# Patient Record
Sex: Female | Born: 2000 | Race: White | Hispanic: No | Marital: Single | Smoking: Never smoker
Health system: Southern US, Community
[De-identification: ages and names within clinical notes are randomized; demographics above are authoritative.]

## PROBLEM LIST (undated history)

## (undated) DIAGNOSIS — G4700 Insomnia, unspecified: Secondary | ICD-10-CM

## (undated) DIAGNOSIS — F419 Anxiety disorder, unspecified: Secondary | ICD-10-CM

---
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
## 2021-03-09 ENCOUNTER — Encounter: Payer: Self-pay | Admitting: Psychology

## 2021-06-08 ENCOUNTER — Other Ambulatory Visit: Payer: Self-pay | Admitting: Student

## 2021-06-08 DIAGNOSIS — S83105D Unspecified dislocation of left knee, subsequent encounter: Secondary | ICD-10-CM

## 2021-06-08 DIAGNOSIS — M25362 Other instability, left knee: Secondary | ICD-10-CM

## 2021-06-08 DIAGNOSIS — M2410 Other articular cartilage disorders, unspecified site: Secondary | ICD-10-CM

## 2021-06-28 ENCOUNTER — Other Ambulatory Visit: Payer: BC Managed Care – PPO

## 2021-06-30 ENCOUNTER — Ambulatory Visit
Admission: RE | Admit: 2021-06-30 | Discharge: 2021-06-30 | Disposition: A | Discharge status: Home / Self Care | Payer: BC Managed Care – PPO | Source: Ambulatory Visit | Attending: Student | Admitting: Student

## 2021-06-30 DIAGNOSIS — M25361 Other instability, right knee: Secondary | ICD-10-CM

## 2021-06-30 DIAGNOSIS — M2410 Other articular cartilage disorders, unspecified site: Secondary | ICD-10-CM

## 2021-06-30 DIAGNOSIS — S83105D Unspecified dislocation of left knee, subsequent encounter: Secondary | ICD-10-CM

## 2021-07-18 ENCOUNTER — Ambulatory Visit
Admission: RE | Admit: 2021-07-18 | Discharge: 2021-07-18 | Disposition: A | Discharge status: Home / Self Care | Payer: BC Managed Care – PPO | Source: Ambulatory Visit | Attending: Orthopedic Surgery | Admitting: Orthopedic Surgery

## 2021-07-18 ENCOUNTER — Ambulatory Visit
Admission: RE | Admit: 2021-07-18 | Discharge: 2021-07-18 | Disposition: A | Discharge status: Home / Self Care | Payer: BC Managed Care – PPO | Attending: Orthopedic Surgery | Admitting: Orthopedic Surgery

## 2021-07-18 ENCOUNTER — Other Ambulatory Visit: Payer: Self-pay | Admitting: Orthopedic Surgery

## 2021-07-18 DIAGNOSIS — S83006A Unspecified dislocation of unspecified patella, initial encounter: Secondary | ICD-10-CM | POA: Diagnosis present

## 2021-07-20 ENCOUNTER — Encounter: Payer: Self-pay | Admitting: Psychology

## 2021-07-20 ENCOUNTER — Encounter: Payer: BC Managed Care – PPO | Attending: Psychology | Admitting: Psychology

## 2021-07-20 DIAGNOSIS — R41840 Attention and concentration deficit: Secondary | ICD-10-CM | POA: Diagnosis present

## 2021-07-20 DIAGNOSIS — F840 Autistic disorder: Secondary | ICD-10-CM | POA: Diagnosis present

## 2021-07-20 NOTE — Progress Notes (Signed)
Neuropsychological Consultation   Patient:   Jacqueline Burch   DOB:   08/05/2000  MR Number:  [id number]  Location:  [facility] PHYSICAL MEDICINE AND REHABILITATION [address] [place], [address] [id number] [facility] [place] [address] Dept: [phone]           Date of Service:   07/20/2021  Start Time:   3 PM End Time:   5 PM  Today's visit was an in person visit dose conducted in my outpatient clinic office.  The patient, her mother and myself were present for this visit.  1 hour and 15 minutes was spent in face-to-face clinical interview and the other 45 minutes was spent with records review, report writing and setting up testing protocols.  Provider/Observer:  Arley Phenix, Psy.D.       Clinical Neuropsychologist       Billing Code/Service: 96116/96121  Chief Complaint:    Jacqueline Burch is a 21 year old female referred by her treating neurologist Hemang Wallis Mart, MD for neuropsychological evaluation to facilitate with differential diagnostic considerations.  The patient has been diagnosed with primary insomnia with excessive daytime somnolence without signs or symptoms suggestive of cataplexy, hypnagogic or hypnopompic hallucinations.  There are also concerns about the possibility of an underlying attention deficit disorder and autistic spectrum disorder types of conditions.  The patient has had previous sleep studies conducted in 2020 through [facility] with Dr. Vickey Huger due to limited sleep intervals, being fatigued and exhausted during the day but unable to sleep, anxiety type symptoms.  Racing thoughts, worry and test anxiety are issues described are attributed in the past.  During this evaluation there were no indications of organic sleep disorder identified although sleep was short and the patient appeared to have difficulty going into REM sleep without immediately gaining arousal and waking  up through previous studies.  Reason for Service:  Jacqueline Burch is a 21 year old female referred by her treating neurologist Hemang Wallis Mart, MD for neuropsychological evaluation to facilitate with differential diagnostic considerations.  The patient has been diagnosed with primary insomnia with excessive daytime somnolence without signs or symptoms suggestive of cataplexy, hypnagogic or hypnopompic hallucinations.  There are also concerns about the possibility of an underlying attention deficit disorder and autistic spectrum disorder types of conditions.  The patient has had previous sleep studies conducted in 2020 through [facility] with Dr. Vickey Huger due to limited sleep intervals, being fatigued and exhausted during the day but unable to sleep, anxiety type symptoms.  Racing thoughts, worry and test anxiety are issues described are attributed in the past.  During this evaluation there were no indications of organic sleep disorder identified although sleep was short and the patient appeared to have difficulty going into REM sleep without immediately gaining arousal and waking up through previous studies.  During today's clinical interview the patient and her mother both identified issues with ongoing insomnia and concerns that it may be related to adult residual ADHD types of symptoms which was what the patient was primarily wanting to be assessed for.  The patient has had insomnia since early middle school and is also dealt with issues around time management, trouble focusing and attending and difficulties multitasking.  The patient reports that she cannot sit still and is easily distracted particularly if there is background noise or other distractors.  The patient reports that she has trouble finding the words that she wants to say.  She also describes significant sensitivity to  various textures particularly velvet are "[place] type textures.  Textures of certain foods really  cause her trouble particularly if they are grainy in someway.  Too many sounds going on also cause her a lot of difficulties.  Her mother reports that she needs to have "things to be just right to cope and manage."  The patient reports that she is easily thrown off track with her attention and she always had difficulty in school and often took longer to complete tasks.  She describes poor memory and at night she cannot shut her "mind off".  She reports that she has trouble looking people in the eye reports that the symptoms of always been there.  She reports that it took her a while to see how this was happening in her life and what issues were being developed.  The patient's mother reports that these issues have been underlying issues throughout her life.  It became very clear when she started school that there were difficulties and the teachers did not always understand what was going on.  By the third or fourth grade teachers began to better understand how she learned and there was an IEP in place.  Formal testing was conducted in the third or fourth grade noting that her writing skills were at the kindergarten level but her verbal understanding and verbal expressive skills were in the eighth grade level.  Adjustments were made and the patient had tests and answers provided on a verbal format rather than written and in middle school she was given a private room and more time to complete task.  Patient's mother reports that over the past 3 to 4 years patient has become more confident in verbalizing her feelings and what causes her difficulty in expressing when things are "too much."  The patient denies significant obsessive/intrusive thoughts but she will get consumed with thinking about hobbies or things that she wants to do.  She reports that she will fidget or do other things like folding origami when listening and she feels like it helps her listen better when she has her mind in her hands doing something  different.  The patient reports that she averages about 4 hours of sleep at night and her sleep became very disturbed in late middle school.  2020 sleep study done by Dr. Vickey Huger did not find organic reasons for insomnia but difficulties when entering REM sleep and staying in REM sleep.  Her attending psychiatrist has made specific recommendations around improvements in sleep hygiene but I am not sure a lot of those interventions have been implemented.   Behavioral Observation: Keymoni Mccaster  presents as a 21 y.o.-year-old Right handed Caucasian Female who appeared her stated age. her dress was Appropriate and she was Well Groomed and her manners were Appropriate to the situation.  her participation was indicative of Appropriate behaviors although the patient clearly had difficulty making eye contact and became frustrated on several occasions during the clinical interview with both myself as well as her mother.  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  she displayed an appropriate level of cooperation and motivation.     Interactions:    Active Drowsy and Resistant  Attention:   abnormal and patient tended to be distracted by internal preoccupations  Memory:   within normal limits; recent and remote memory intact  Visuo-spatial:  not  examined  Speech (Volume):  low  Speech:   normal; normal  Thought Process:  Coherent and Relevant  Though Content:  WNL; not suicidal and not homicidal  Orientation:   person, place, time/date, and situation  Judgment:   Fair  Planning:   Fair  Affect:    Anxious, Irritable, and Lethargic  Mood:    Dysphoric  Insight:   Fair  Intelligence:   normal  Marital Status/Living: The patient was born and raised in [facility] [place] [facility] [place] along with 1 sibling.  The patient currently lives  with her parents and her siblings have been living with them since she was born.  The patient is not married and has no children.  Current Employment: The patient is currently not working.   Substance Use:  No concerns of substance abuse are reported.  Education:   The patient graduated from high school with a 3.15 grade point average but did need classroom and testing adjustments from middle school and high school including initially having material read to her and taking test orally as well as having private rooms to take her test and more time to take.  The patient has received awards in high school for her art.  Medical History:  History reviewed. No pertinent past medical history.      There are no problems to display for this patient.             Abuse/Trauma History: No reports of any history of traumatic or abusive life experiences.  Psychiatric History:  The patient has had difficulty in some academic development and was evaluated for the possibility of psychoeducational delays.  She had adjustments in school for difficulties development and reading.  Family Med/Psych History: History reviewed. No pertinent family history.  Impression/DX:  Victorya Hillman is a 21 year old female referred by her treating neurologist Hemang Wallis Mart, MD for neuropsychological evaluation to facilitate with differential diagnostic considerations.  The patient has been diagnosed with primary insomnia with excessive daytime somnolence without signs or symptoms suggestive of cataplexy, hypnagogic or hypnopompic hallucinations.  There are also concerns about the possibility of an underlying attention deficit disorder and autistic spectrum disorder types of conditions.  The patient has had previous sleep studies conducted in 2020 through [facility] with Dr. Vickey Huger due to limited sleep intervals, being fatigued and exhausted during the day but unable to sleep, anxiety type  symptoms.  Racing thoughts, worry and test anxiety are issues described are attributed in the past.  During this evaluation there were no indications of organic sleep disorder identified although sleep was short and the patient appeared to have difficulty going into REM sleep without immediately gaining arousal and waking up through previous studies.  Disposition/Plan:  We have set the patient up for formal neuropsychological assessment to facilitate with differential diagnosis including issues related to attention deficit, autistic spectrum disorder and insomnia issues.  The patient will complete the [place] multiphasic personality inventory as well as complete the comprehensive attention battery and the CPT measures.  Once these are completed a formal report will be conducted with diagnostic considerations included as well as any treatment recommendations that arise out of the assessment.  I will sit down with the patient and go over all the results as well as make a copy of it available to her referring neurologist and I will be available in the patient's EMR for her treatment team and available on my chart for the patient.  Diagnosis:    Attention and concentration  deficit  Autistic spectrum disorder         Electronically Signed   _______________________ Arley PhenixJohn Olanrewaju Osborn, Psy.D. Clinical Neuropsychologist

## 2021-07-21 ENCOUNTER — Other Ambulatory Visit: Payer: Self-pay | Admitting: Orthopedic Surgery

## 2021-07-27 DIAGNOSIS — R41840 Attention and concentration deficit: Secondary | ICD-10-CM | POA: Diagnosis not present

## 2021-07-27 DIAGNOSIS — F840 Autistic disorder: Secondary | ICD-10-CM

## 2021-07-27 NOTE — Progress Notes (Signed)
   Behavioral Observations The patient appeared well-groomed and appropriately dressed. Her manners were polite and appropriate to the situation. The patient seemed to be somewhat bothered by the volume of the speakers during the test, frequently adjusting the volume to a lower level.    Neuropsychology Note  [name] completed 90 minutes of neuropsychological testing with technician, Marica Otter, BA, under the supervision of Arley Phenix, PsyD., Clinical Neuropsychologist. The patient did not appear overtly distressed by the testing session, per behavioral observation or via self-report to the technician. Rest breaks were offered.   Clinical Decision Making: In considering the patient's current level of functioning, level of presumed impairment, nature of symptoms, emotional and behavioral responses during clinical interview, level of literacy, and observed level of motivation/effort, a battery of tests was selected by Dr. Kieth Brightly during initial consultation on 07/20/2021. This was communicated to the technician. Communication between the neuropsychologist and technician was ongoing throughout the testing session and changes were made as deemed necessary based on patient performance on testing, technician observations and additional pertinent factors such as those listed above.  Tests Administered: Comprehensive Attention Battery (CAB) Continuous Performance Test (CPT)  Results: Will be included in final report   Feedback to Patient: Jacqueline Burch will return on 11/02/2021 for an interactive feedback session with Dr. Kieth Brightly at which time her test performances, clinical impressions and treatment recommendations will be reviewed in detail. The patient understands she can contact our office should she require our assistance before this time.  90 minutes spent face-to-face with patient administering standardized tests, 30 minutes spent scoring Radiographer, therapeutic). [CPT [id number],  96139]  Full report to follow.

## 2021-08-07 ENCOUNTER — Inpatient Hospital Stay: Admission: RE | Admit: 2021-08-07 | Payer: BC Managed Care – PPO | Source: Ambulatory Visit

## 2021-08-09 ENCOUNTER — Encounter
Admission: RE | Admit: 2021-08-09 | Discharge: 2021-08-09 | Disposition: A | Discharge status: Home / Self Care | Payer: BC Managed Care – PPO | Source: Ambulatory Visit | Attending: Orthopedic Surgery | Admitting: Orthopedic Surgery

## 2021-08-09 ENCOUNTER — Other Ambulatory Visit: Payer: Self-pay

## 2021-08-09 VITALS — Ht 60.0 in | Wt 203.0 lb

## 2021-08-09 DIAGNOSIS — Z01818 Encounter for other preprocedural examination: Secondary | ICD-10-CM

## 2021-08-09 HISTORY — DX: Anxiety disorder, unspecified: F41.9

## 2021-08-09 HISTORY — DX: Insomnia, unspecified: G47.00

## 2021-08-09 NOTE — Patient Instructions (Addendum)
Your procedure is scheduled on: 08/14/2021  Report to the Registration Desk on the 1st floor of the [facility]. To find out your arrival time, please call [phone] between 1PM - 3PM on: 08/11/2021  If your arrival time is 6:00 am, do not arrive prior to that time as the [facility] entrance doors do not open until 6:00 am.  REMEMBER: Instructions that are not followed completely may result in serious medical risk, up to and including death; or upon the discretion of your surgeon and anesthesiologist your surgery may need to be rescheduled.  Do not eat food after midnight the night before surgery.  No gum chewing, lozengers or hard candies.  You may however, drink CLEAR liquids up to 2 hours before you are scheduled to arrive for your surgery. Do not drink anything within 2 hours of your scheduled arrival time.  Clear liquids include: - water  - apple juice without pulp - gatorade (not RED colors) - Ensure Pre-Surgery Clear Carbohydrate Drink   In addition, your doctor has ordered for you to drink the provided pre surgery drink. Drinking this carbohydrate drink up to two hours before surgery helps to reduce insulin resistance and improve patient outcomes. Please complete drinking 2 hours prior to scheduled arrival time. Do NOT drink anything that is not on this list   One week prior to surgery: Stop Anti-inflammatories (NSAIDS) such as Advil, Aleve, Ibuprofen, Motrin, Naproxen, Naprosyn and Aspirin based products such as Excedrin, Goodys Powder, BC Powder. Stop ANY OVER THE COUNTER supplements until after surgery .  You may however, continue to take Tylenol if needed for pain up until the day of surgery.   On the morning of surgery brush your teeth with toothpaste and water, you may rinse your mouth with mouthwash if you wish. Do not swallow any toothpaste or mouthwash.  Use CHG Soap as directed on instruction sheet. -provided for you. This is very important to prevent  infections.   Do not wear jewelry, make-up, hairpins, clips or nail polish.  Do not wear lotions, powders, creams, ointments, perfumes or deodorant.  Do not shave body from the neck down 48 hours prior to surgery just in case you cut yourself which could leave a site for infection.  Also, freshly shaved skin may become irritated if using the CHG soap.  Contact lenses, hearing aids and dentures may not be worn into surgery.  Do not bring valuables to the hospital. [facility] is not responsible for any missing/lost belongings or valuables.   Notify your doctor if there is any change in your medical condition (cold, fever, infection).  Wear comfortable clothing (specific to your surgery type) to the hospital.  After surgery, you can help prevent lung complications by doing breathing exercises.  Take deep breaths and cough every 1-2 hours. Your doctor may order a device called an Incentive Spirometer to help you take deep breaths.  If you are being admitted to the hospital overnight, leave your suitcase in the car. After surgery it may be brought to your room.  If you are being discharged the day of surgery, you will not be allowed to drive home. You will need a responsible adult (18 years or older) to drive you home and stay with you that night.   If you are taking public transportation, you will need to have a responsible adult (18 years or older) with you. Please confirm with your physician that it is acceptable to use public transportation.   Please call  the Pre-admissions Testing Dept. at [phone] if you have any questions about these instructions.  Surgery Visitation Policy:  Patients undergoing a surgery or procedure may have two family members or support persons with them as long as the person is not COVID-19 positive or experiencing its symptoms.

## 2021-08-13 NOTE — Anesthesia Preprocedure Evaluation (Signed)
Anesthesia Evaluation  Patient identified by MRN, date of birth, ID band Patient awake    Reviewed: Allergy & Precautions, NPO status , Patient's Chart, lab work & pertinent test results  Airway Mallampati: II  TM Distance: >3 FB Neck ROM: full    Dental no notable dental hx.    Pulmonary neg pulmonary ROS,    Pulmonary exam normal        Cardiovascular negative cardio ROS Normal cardiovascular exam     Neuro/Psych negative neurological ROS  negative psych ROS   GI/Hepatic negative GI ROS, Neg liver ROS,   Endo/Other  negative endocrine ROS  Renal/GU      Musculoskeletal Dislocation of left knee   Abdominal (+) + obese,   Peds  Hematology negative hematology ROS (+)   Anesthesia Other Findings Past Medical History: No date: Anxiety No date: Insomnia No date: Insomnia  No past surgical history on file.     Reproductive/Obstetrics negative OB ROS                             Anesthesia Physical Anesthesia Plan  ASA: 2  Anesthesia Plan: General ETT   Post-op Pain Management: Tylenol PO (pre-op)*, Regional block*, Gabapentin PO (pre-op)* and Celebrex PO (pre-op)*   Induction: Intravenous  PONV Risk Score and Plan: 3 and Ondansetron, Dexamethasone and Midazolam  Airway Management Planned: Oral ETT  Additional Equipment:   Intra-op Plan:   Post-operative Plan:   Informed Consent:     Dental Advisory Given  Plan Discussed with: Anesthesiologist, CRNA and Surgeon  Anesthesia Plan Comments:         Anesthesia Quick Evaluation

## 2021-08-14 ENCOUNTER — Ambulatory Visit: Payer: BC Managed Care – PPO

## 2021-08-14 ENCOUNTER — Other Ambulatory Visit: Payer: Self-pay

## 2021-08-14 ENCOUNTER — Encounter: Payer: Self-pay | Admitting: Orthopedic Surgery

## 2021-08-14 ENCOUNTER — Ambulatory Visit: Payer: BC Managed Care – PPO | Admitting: Anesthesiology

## 2021-08-14 ENCOUNTER — Observation Stay
Admission: RE | Admit: 2021-08-14 | Discharge: 2021-08-16 | Disposition: A | Discharge status: Home / Self Care | Payer: BC Managed Care – PPO | Attending: Orthopedic Surgery | Admitting: Orthopedic Surgery

## 2021-08-14 ENCOUNTER — Encounter
Admission: RE | Disposition: A | Discharge status: Home / Self Care | Payer: Self-pay | Source: Home / Self Care | Attending: Orthopedic Surgery

## 2021-08-14 DIAGNOSIS — X58XXXA Exposure to other specified factors, initial encounter: Secondary | ICD-10-CM | POA: Diagnosis not present

## 2021-08-14 DIAGNOSIS — S83006A Unspecified dislocation of unspecified patella, initial encounter: Secondary | ICD-10-CM | POA: Diagnosis present

## 2021-08-14 DIAGNOSIS — S83005A Unspecified dislocation of left patella, initial encounter: Principal | ICD-10-CM | POA: Insufficient documentation

## 2021-08-14 DIAGNOSIS — M228X2 Other disorders of patella, left knee: Secondary | ICD-10-CM | POA: Diagnosis not present

## 2021-08-14 DIAGNOSIS — Z01818 Encounter for other preprocedural examination: Secondary | ICD-10-CM

## 2021-08-14 DIAGNOSIS — M222X2 Patellofemoral disorders, left knee: Secondary | ICD-10-CM | POA: Insufficient documentation

## 2021-08-14 HISTORY — PX: KNEE ARTHROSCOPY WITH MEDIAL PATELLAR FEMORAL LIGAMENT RECONSTRUCTION: SHX5652

## 2021-08-14 LAB — POCT PREGNANCY, URINE: Preg Test, Ur: NEGATIVE

## 2021-08-14 SURGERY — REPAIR, TENDON, PATELLAR, ARTHROSCOPIC
Anesthesia: General | Site: Knee | Laterality: Left

## 2021-08-14 MED ORDER — CEFAZOLIN SODIUM-DEXTROSE 2-4 GM/100ML-% IV SOLN
2.0000 g | Freq: Four times a day (QID) | INTRAVENOUS | Status: AC
  Administered 2021-08-14 – 2021-08-15 (×2): 2 g via INTRAVENOUS
  Filled 2021-08-14 (×2): qty 100

## 2021-08-14 MED ORDER — SENNOSIDES-DOCUSATE SODIUM 8.6-50 MG PO TABS: 1.0000 | ORAL_TABLET | Freq: Every evening | ORAL | Status: DC | PRN

## 2021-08-14 MED ORDER — GABAPENTIN 300 MG PO CAPS: 300.0000 mg | ORAL_CAPSULE | ORAL | Status: AC

## 2021-08-14 MED ORDER — LIDOCAINE-EPINEPHRINE 1 %-1:100000 IJ SOLN
INTRAMUSCULAR | Status: AC
  Filled 2021-08-14: qty 1

## 2021-08-14 MED ORDER — LIDOCAINE HCL (PF) 1 % IJ SOLN
INTRAMUSCULAR | Status: AC
  Filled 2021-08-14: qty 5

## 2021-08-14 MED ORDER — ONDANSETRON HCL 4 MG/2ML IJ SOLN: 4.0000 mg | Freq: Four times a day (QID) | INTRAMUSCULAR | Status: DC | PRN

## 2021-08-14 MED ORDER — BUPIVACAINE HCL (PF) 0.5 % IJ SOLN
INTRAMUSCULAR | Status: AC
  Filled 2021-08-14: qty 30

## 2021-08-14 MED ORDER — MIDAZOLAM HCL 2 MG/2ML IJ SOLN
INTRAMUSCULAR | Status: DC | PRN
  Administered 2021-08-14: 2 mg via INTRAVENOUS

## 2021-08-14 MED ORDER — ONDANSETRON HCL 4 MG PO TABS: 4.0000 mg | ORAL_TABLET | Freq: Four times a day (QID) | ORAL | Status: DC | PRN

## 2021-08-14 MED ORDER — LIDOCAINE HCL (PF) 1 % IJ SOLN
INTRAMUSCULAR | Status: DC | PRN
  Administered 2021-08-14 (×2): .5 mL via SUBCUTANEOUS

## 2021-08-14 MED ORDER — MIDAZOLAM HCL 2 MG/2ML IJ SOLN: 2.0000 mg | Freq: Once | INTRAMUSCULAR | Status: AC

## 2021-08-14 MED ORDER — NEOMYCIN-POLYMYXIN B GU 40-200000 IR SOLN
Status: AC
  Filled 2021-08-14: qty 20

## 2021-08-14 MED ORDER — LACTATED RINGERS IV SOLN: INTRAVENOUS | Status: DC

## 2021-08-14 MED ORDER — PROPOFOL 10 MG/ML IV BOLUS
INTRAVENOUS | Status: AC
  Filled 2021-08-14: qty 20

## 2021-08-14 MED ORDER — FENTANYL CITRATE (PF) 100 MCG/2ML IJ SOLN
25.0000 ug | INTRAMUSCULAR | Status: DC | PRN
  Administered 2021-08-14 (×2): 50 ug via INTRAVENOUS

## 2021-08-14 MED ORDER — BUPIVACAINE HCL (PF) 0.5 % IJ SOLN
INTRAMUSCULAR | Status: DC | PRN
  Administered 2021-08-14 (×2): 50 mg via PERINEURAL

## 2021-08-14 MED ORDER — ACETAMINOPHEN 10 MG/ML IV SOLN: 1000.0000 mg | Freq: Once | INTRAVENOUS | Status: DC | PRN

## 2021-08-14 MED ORDER — ORAL CARE MOUTH RINSE: 15.0000 mL | Freq: Once | OROMUCOSAL | Status: AC

## 2021-08-14 MED ORDER — OXYCODONE HCL 5 MG PO TABS
5.0000 mg | ORAL_TABLET | ORAL | Status: DC | PRN
  Administered 2021-08-14 – 2021-08-15 (×3): 10 mg via ORAL
  Administered 2021-08-16: 5 mg via ORAL
  Filled 2021-08-14 (×2): qty 2
  Filled 2021-08-14: qty 1
  Filled 2021-08-14: qty 2

## 2021-08-14 MED ORDER — ACETAMINOPHEN 500 MG PO TABS
ORAL_TABLET | ORAL | Status: AC
  Filled 2021-08-14: qty 2

## 2021-08-14 MED ORDER — PHENYLEPHRINE 80 MCG/ML (10ML) SYRINGE FOR IV PUSH (FOR BLOOD PRESSURE SUPPORT)
PREFILLED_SYRINGE | INTRAVENOUS | Status: AC
  Filled 2021-08-14: qty 10

## 2021-08-14 MED ORDER — ACETAMINOPHEN 500 MG PO TABS
1000.0000 mg | ORAL_TABLET | Freq: Once | ORAL | Status: AC
  Administered 2021-08-14: 1000 mg via ORAL

## 2021-08-14 MED ORDER — LIDOCAINE HCL (CARDIAC) PF 100 MG/5ML IV SOSY
PREFILLED_SYRINGE | INTRAVENOUS | Status: DC | PRN
  Administered 2021-08-14: 50 mg via INTRAVENOUS

## 2021-08-14 MED ORDER — RINGERS IRRIGATION IR SOLN
Status: DC | PRN
  Administered 2021-08-14: 6000 mL

## 2021-08-14 MED ORDER — CHLORHEXIDINE GLUCONATE 0.12 % MT SOLN: 15.0000 mL | Freq: Once | OROMUCOSAL | Status: AC

## 2021-08-14 MED ORDER — KETAMINE HCL 10 MG/ML IJ SOLN
INTRAMUSCULAR | Status: DC | PRN
  Administered 2021-08-14: 10 mg via INTRAVENOUS
  Administered 2021-08-14: 30 mg via INTRAVENOUS
  Administered 2021-08-14: 10 mg via INTRAVENOUS

## 2021-08-14 MED ORDER — ACETAMINOPHEN 325 MG PO TABS: 325.0000 mg | ORAL_TABLET | Freq: Four times a day (QID) | ORAL | Status: DC | PRN

## 2021-08-14 MED ORDER — SODIUM CHLORIDE 0.9 % IV SOLN: INTRAVENOUS | Status: DC

## 2021-08-14 MED ORDER — 0.9 % SODIUM CHLORIDE (POUR BTL) OPTIME
TOPICAL | Status: DC | PRN
  Administered 2021-08-14: 1000 mL

## 2021-08-14 MED ORDER — GABAPENTIN 300 MG PO CAPS
ORAL_CAPSULE | ORAL | Status: AC
  Administered 2021-08-14: 300 mg via ORAL
  Filled 2021-08-14: qty 1

## 2021-08-14 MED ORDER — ACETAMINOPHEN 500 MG PO TABS
1000.0000 mg | ORAL_TABLET | Freq: Four times a day (QID) | ORAL | Status: DC
  Administered 2021-08-14 – 2021-08-16 (×7): 1000 mg via ORAL
  Filled 2021-08-14 (×8): qty 2

## 2021-08-14 MED ORDER — BUPIVACAINE LIPOSOME 1.3 % IJ SUSP
INTRAMUSCULAR | Status: DC | PRN
  Administered 2021-08-14: 40 mL via INTRAMUSCULAR

## 2021-08-14 MED ORDER — OXYCODONE HCL 5 MG/5ML PO SOLN: 5.0000 mg | Freq: Once | ORAL | Status: DC | PRN

## 2021-08-14 MED ORDER — DOCUSATE SODIUM 100 MG PO CAPS
100.0000 mg | ORAL_CAPSULE | Freq: Two times a day (BID) | ORAL | Status: DC
  Administered 2021-08-15 – 2021-08-16 (×4): 100 mg via ORAL
  Filled 2021-08-14 (×4): qty 1

## 2021-08-14 MED ORDER — CEFAZOLIN SODIUM-DEXTROSE 2-4 GM/100ML-% IV SOLN
2.0000 g | INTRAVENOUS | Status: AC
  Administered 2021-08-14: 2 g via INTRAVENOUS

## 2021-08-14 MED ORDER — ROCURONIUM BROMIDE 100 MG/10ML IV SOLN
INTRAVENOUS | Status: DC | PRN
  Administered 2021-08-14: 80 mg via INTRAVENOUS

## 2021-08-14 MED ORDER — MIDAZOLAM HCL 2 MG/2ML IJ SOLN
INTRAMUSCULAR | Status: AC
  Administered 2021-08-14: 2 mg via INTRAVENOUS
  Filled 2021-08-14: qty 2

## 2021-08-14 MED ORDER — FENTANYL CITRATE (PF) 100 MCG/2ML IJ SOLN
INTRAMUSCULAR | Status: AC
  Filled 2021-08-14: qty 2

## 2021-08-14 MED ORDER — CELECOXIB 200 MG PO CAPS
ORAL_CAPSULE | ORAL | Status: AC
  Administered 2021-08-14: 200 mg via ORAL
  Filled 2021-08-14: qty 1

## 2021-08-14 MED ORDER — DROPERIDOL 2.5 MG/ML IJ SOLN: 0.6250 mg | Freq: Once | INTRAMUSCULAR | Status: DC | PRN

## 2021-08-14 MED ORDER — OXYCODONE HCL 5 MG PO TABS: 5.0000 mg | ORAL_TABLET | Freq: Once | ORAL | Status: DC | PRN

## 2021-08-14 MED ORDER — DIPHENHYDRAMINE HCL 12.5 MG/5ML PO ELIX: 12.5000 mg | ORAL_SOLUTION | ORAL | Status: DC | PRN

## 2021-08-14 MED ORDER — MIDAZOLAM HCL 2 MG/2ML IJ SOLN
INTRAMUSCULAR | Status: AC
  Filled 2021-08-14: qty 2

## 2021-08-14 MED ORDER — DEXMEDETOMIDINE (PRECEDEX) IN NS 20 MCG/5ML (4 MCG/ML) IV SYRINGE
PREFILLED_SYRINGE | INTRAVENOUS | Status: DC | PRN
  Administered 2021-08-14: 4 ug via INTRAVENOUS
  Administered 2021-08-14: 8 ug via INTRAVENOUS
  Administered 2021-08-14 (×2): 4 ug via INTRAVENOUS

## 2021-08-14 MED ORDER — METHOCARBAMOL 1000 MG/10ML IJ SOLN: 500.0000 mg | Freq: Four times a day (QID) | INTRAVENOUS | Status: DC | PRN

## 2021-08-14 MED ORDER — CHLORHEXIDINE GLUCONATE 0.12 % MT SOLN
OROMUCOSAL | Status: AC
  Administered 2021-08-14: 15 mL via OROMUCOSAL
  Filled 2021-08-14: qty 15

## 2021-08-14 MED ORDER — FENTANYL CITRATE (PF) 100 MCG/2ML IJ SOLN
INTRAMUSCULAR | Status: DC | PRN
  Administered 2021-08-14 (×3): 50 ug via INTRAVENOUS

## 2021-08-14 MED ORDER — KETAMINE HCL 50 MG/5ML IJ SOSY
PREFILLED_SYRINGE | INTRAMUSCULAR | Status: AC
  Filled 2021-08-14: qty 5

## 2021-08-14 MED ORDER — PROMETHAZINE HCL 25 MG/ML IJ SOLN: 6.2500 mg | INTRAMUSCULAR | Status: DC | PRN

## 2021-08-14 MED ORDER — DEXAMETHASONE SODIUM PHOSPHATE 10 MG/ML IJ SOLN
INTRAMUSCULAR | Status: DC | PRN
  Administered 2021-08-14: 10 mg via INTRAVENOUS

## 2021-08-14 MED ORDER — ONDANSETRON HCL 4 MG/2ML IJ SOLN
INTRAMUSCULAR | Status: DC | PRN
  Administered 2021-08-14: 4 mg via INTRAVENOUS

## 2021-08-14 MED ORDER — FAMOTIDINE 20 MG PO TABS: 20.0000 mg | ORAL_TABLET | Freq: Once | ORAL | Status: AC

## 2021-08-14 MED ORDER — NEOMYCIN-POLYMYXIN B GU 40-200000 IR SOLN
Status: DC | PRN
  Administered 2021-08-14: 4 mL

## 2021-08-14 MED ORDER — METHOCARBAMOL 500 MG PO TABS
500.0000 mg | ORAL_TABLET | Freq: Four times a day (QID) | ORAL | Status: DC | PRN
  Administered 2021-08-14: 500 mg via ORAL
  Filled 2021-08-14: qty 1

## 2021-08-14 MED ORDER — BUPIVACAINE LIPOSOME 1.3 % IJ SUSP
INTRAMUSCULAR | Status: AC
Start: 2021-08-14 — End: ?
  Filled 2021-08-14: qty 20

## 2021-08-14 MED ORDER — HYDROMORPHONE HCL 1 MG/ML IJ SOLN: 0.2000 mg | INTRAMUSCULAR | Status: DC | PRN

## 2021-08-14 MED ORDER — OXYCODONE HCL 5 MG PO TABS: 10.0000 mg | ORAL_TABLET | ORAL | Status: DC | PRN

## 2021-08-14 MED ORDER — SUGAMMADEX SODIUM 200 MG/2ML IV SOLN
INTRAVENOUS | Status: DC | PRN
  Administered 2021-08-14: 200 mg via INTRAVENOUS

## 2021-08-14 MED ORDER — CEFAZOLIN SODIUM-DEXTROSE 2-4 GM/100ML-% IV SOLN
INTRAVENOUS | Status: AC
  Filled 2021-08-14: qty 100

## 2021-08-14 MED ORDER — ASPIRIN 325 MG PO TBEC
325.0000 mg | DELAYED_RELEASE_TABLET | Freq: Every day | ORAL | Status: DC
Start: 2021-08-15 — End: 2021-08-16
  Administered 2021-08-15 – 2021-08-16 (×2): 325 mg via ORAL
  Filled 2021-08-14 (×2): qty 1

## 2021-08-14 MED ORDER — GABAPENTIN 600 MG PO TABS
300.0000 mg | ORAL_TABLET | Freq: Once | ORAL | Status: DC
  Filled 2021-08-14: qty 0.5

## 2021-08-14 MED ORDER — FAMOTIDINE 20 MG PO TABS
ORAL_TABLET | ORAL | Status: AC
  Administered 2021-08-14: 20 mg via ORAL
  Filled 2021-08-14: qty 1

## 2021-08-14 MED ORDER — PROPOFOL 10 MG/ML IV BOLUS
INTRAVENOUS | Status: DC | PRN
  Administered 2021-08-14: 200 mg via INTRAVENOUS

## 2021-08-14 MED ORDER — CELECOXIB 200 MG PO CAPS: 200.0000 mg | ORAL_CAPSULE | Freq: Once | ORAL | Status: AC

## 2021-08-14 SURGICAL SUPPLY — 106 items
ADAPTER IRRIG TUBE 2 SPIKE SOL (ADAPTER) ×2 IMPLANT
ANCHOR ALL-SUT Q-FIX 1.8 BLUE (Anchor) ×2 IMPLANT
ANCHOR ALL-SUT Q-FIX 2.8 (Anchor) ×1 IMPLANT
BASIN GRAD PLASTIC 32OZ STRL (MISCELLANEOUS) ×2 IMPLANT
BIT DRILL Q COUPLING 4.5 (BIT) ×1 IMPLANT
BIT DRILL Q/COUPLING 1 (BIT) ×1 IMPLANT
BLADE FULL RADIUS 3.5 (BLADE) ×2 IMPLANT
BLADE SAW 90X13X1.19 OSCILLAT (BLADE) ×1 IMPLANT
BLADE SHAVER 4.5X7 STR FR (MISCELLANEOUS) IMPLANT
BLADE SURG SZ10 CARB STEEL (BLADE) ×2 IMPLANT
BLADE SURG SZ11 CARB STEEL (BLADE) ×2 IMPLANT
BNDG ESMARK 6X12 TAN STRL LF (GAUZE/BANDAGES/DRESSINGS) ×2 IMPLANT
BRACE KNEE POST OP SHORT (BRACE) ×2 IMPLANT
BUR 4X45 EGG (BURR) ×1 IMPLANT
BUR 4X55 1 (BURR) ×1 IMPLANT
CHLORAPREP W/TINT 26 (MISCELLANEOUS) ×2 IMPLANT
COOLER POLAR GLACIER W/PUMP (MISCELLANEOUS) ×2 IMPLANT
COVER MAYO STAND STRL (DRAPES) ×2 IMPLANT
CUFF TOURN SGL QUICK 24 (TOURNIQUET CUFF)
CUFF TOURN SGL QUICK 34 (TOURNIQUET CUFF)
CUFF TRNQT CYL 24X4X16.5-23 (TOURNIQUET CUFF) ×1 IMPLANT
CUFF TRNQT CYL 34X4.125X (TOURNIQUET CUFF) ×1 IMPLANT
CUP MEDICINE 2OZ PLAST GRAD ST (MISCELLANEOUS) ×1 IMPLANT
DERMABOND ADVANCED (GAUZE/BANDAGES/DRESSINGS) ×2
DERMABOND ADVANCED .7 DNX12 (GAUZE/BANDAGES/DRESSINGS) IMPLANT
DRAPE ARTHRO LIMB 89X125 STRL (DRAPES) ×2 IMPLANT
DRAPE C-ARM XRAY 36X54 (DRAPES) ×2 IMPLANT
DRAPE C-ARMOR (DRAPES) ×2 IMPLANT
DRAPE IMP U-DRAPE 54X76 (DRAPES) ×2 IMPLANT
DRAPE SHEET LG 3/4 BI-LAMINATE (DRAPES) ×2 IMPLANT
DRAPE SURG 17X11 SM STRL (DRAPES) ×2 IMPLANT
DRAPE TABLE BACK 80X90 (DRAPES) ×2 IMPLANT
DRSG OPSITE POSTOP 3X4 (GAUZE/BANDAGES/DRESSINGS) ×2 IMPLANT
ELECT REM PT RETURN 9FT ADLT (ELECTROSURGICAL) ×2
ELECTRODE REM PT RTRN 9FT ADLT (ELECTROSURGICAL) ×1 IMPLANT
GAUZE SPONGE 4X4 12PLY STRL (GAUZE/BANDAGES/DRESSINGS) ×2 IMPLANT
GLOVE BIOGEL PI IND STRL 8 (GLOVE) ×1 IMPLANT
GLOVE BIOGEL PI INDICATOR 8 (GLOVE) ×1
GLOVE SURG ORTHO 8.0 STRL STRW (GLOVE) ×2 IMPLANT
GOWN STRL REUS W/ TWL LRG LVL3 (GOWN DISPOSABLE) ×1 IMPLANT
GOWN STRL REUS W/ TWL XL LVL3 (GOWN DISPOSABLE) ×1 IMPLANT
GOWN STRL REUS W/TWL LRG LVL3 (GOWN DISPOSABLE) ×1
GOWN STRL REUS W/TWL XL LVL3 (GOWN DISPOSABLE) ×1
GRADUATE 1200CC STRL 31836 (MISCELLANEOUS) ×2 IMPLANT
GRAFT TISS SEMITEND 4-8 (Bone Implant) IMPLANT
GUIDEWIRE ORTH 6X062XTROC NS (WIRE) IMPLANT
HANDLE YANKAUER SUCT BULB TIP (MISCELLANEOUS) ×2 IMPLANT
HEMOVAC 400CC 10FR (MISCELLANEOUS) ×1 IMPLANT
IV LACTATED RINGER IRRG 3000ML (IV SOLUTION) ×4
IV LR IRRIG 3000ML ARTHROMATIC (IV SOLUTION) ×8 IMPLANT
K-WIRE .062 (WIRE) ×2
KIT SUTURE 1.8 Q-FIX DISP (KITS) ×1 IMPLANT
KIT SUTURE 2.8 Q-FIX DISP (MISCELLANEOUS) ×2 IMPLANT
KIT TURNOVER KIT A (KITS) ×2 IMPLANT
LABEL OR SOLS (LABEL) ×2 IMPLANT
MANIFOLD NEPTUNE II (INSTRUMENTS) ×4 IMPLANT
MAT ABSORB  FLUID 56X50 GRAY (MISCELLANEOUS) ×1
MAT ABSORB FLUID 56X50 GRAY (MISCELLANEOUS) ×1 IMPLANT
NDL FILTER BLUNT 18X1 1/2 (NEEDLE) ×1 IMPLANT
NDL HYPO 21X1.5 SAFETY (NEEDLE) ×1 IMPLANT
NDL MAYO CATGUT SZ 2 (NEEDLE) IMPLANT
NDL SAFETY ECLIPSE 18X1.5 (NEEDLE) ×1 IMPLANT
NDL SURG EYE 3/8 CRC (MISCELLANEOUS) IMPLANT
NEEDLE FILTER BLUNT 18X 1/2SAF (NEEDLE) ×1
NEEDLE FILTER BLUNT 18X1 1/2 (NEEDLE) ×1 IMPLANT
NEEDLE HYPO 18GX1.5 SHARP (NEEDLE) ×1
NEEDLE HYPO 21X1.5 SAFETY (NEEDLE) ×2 IMPLANT
NEEDLE HYPO 22GX1.5 SAFETY (NEEDLE) ×2 IMPLANT
NEEDLE MAYO CATGUT SZ 1.5 (NEEDLE) ×2
NEEDLE MAYO CATGUT SZ 2 (NEEDLE) ×1 IMPLANT
NEEDLE SURG EYE 3/8 CRC (MISCELLANEOUS) ×2 IMPLANT
NS IRRIG 1000ML POUR BTL (IV SOLUTION) ×2 IMPLANT
PACK ARTHROSCOPY KNEE (MISCELLANEOUS) ×2 IMPLANT
PAD ABD DERMACEA PRESS 5X9 (GAUZE/BANDAGES/DRESSINGS) ×2 IMPLANT
PAD CAST CTTN 4X4 STRL (SOFTGOODS) ×1 IMPLANT
PAD WRAPON POLAR KNEE (MISCELLANEOUS) ×1 IMPLANT
PADDING CAST 6X4YD NS (MISCELLANEOUS) ×1
PADDING CAST COTTON 4X4 STRL (SOFTGOODS) ×1
PADDING CAST COTTON 6X4 NS (MISCELLANEOUS) ×1 IMPLANT
PENCIL ELECTRO HAND CTR (MISCELLANEOUS) ×2 IMPLANT
RETRIEVER SUT HEWSON (MISCELLANEOUS) ×1 IMPLANT
SCREW CORTEX ST 4.5X36 (Screw) ×1 IMPLANT
SCREW CORTEX ST 4.5X40 (Screw) ×1 IMPLANT
SCREW CORTEX ST 4.5X44 (Screw) ×1 IMPLANT
SPONGE T-LAP 18X18 ~~LOC~~+RFID (SPONGE) ×4 IMPLANT
STAPLER SKIN PROX 35W (STAPLE) ×2 IMPLANT
SUCTION FRAZIER HANDLE 10FR (MISCELLANEOUS) ×1
SUCTION TUBE FRAZIER 10FR DISP (MISCELLANEOUS) ×1 IMPLANT
SUT ETHILON 3-0 FS-10 30 BLK (SUTURE) ×2
SUT MNCRL 4-0 (SUTURE) ×3
SUT MNCRL 4-0 27XMFL (SUTURE) ×3
SUT VIC AB 0 CT1 36 (SUTURE) ×2 IMPLANT
SUT VIC AB 2-0 CT2 27 (SUTURE) ×4 IMPLANT
SUTURE EHLN 3-0 FS-10 30 BLK (SUTURE) ×1 IMPLANT
SUTURE MNCRL 4-0 27XMF (SUTURE) IMPLANT
SYR 10ML LL (SYRINGE) ×2 IMPLANT
SYR 30ML LL (SYRINGE) ×2 IMPLANT
SYR 50ML LL SCALE MARK (SYRINGE) ×2 IMPLANT
SYR BULB IRRIG 60ML STRL (SYRINGE) ×2 IMPLANT
TENDON SEMI-TENDINOSUS (Bone Implant) ×2 IMPLANT
TOWEL OR 17X26 4PK STRL BLUE (TOWEL DISPOSABLE) ×2 IMPLANT
TUBING INFLOW SET DBFLO PUMP (TUBING) ×2 IMPLANT
TUBING OUTFLOW SET DBLFO PUMP (TUBING) ×2 IMPLANT
WAND WEREWOLF FLOW 90D (MISCELLANEOUS) ×2 IMPLANT
WATER STERILE IRR 500ML POUR (IV SOLUTION) ×2 IMPLANT
WRAPON POLAR PAD KNEE (MISCELLANEOUS) ×2

## 2021-08-14 NOTE — Progress Notes (Signed)
Informed pt's parents pt is being moved to room 132.

## 2021-08-14 NOTE — Anesthesia Procedure Notes (Signed)
Anesthesia Regional Block: Adductor canal block   Pre-Anesthetic Checklist: , timeout performed,  Correct Patient, Correct Site, Correct Laterality,  Correct Procedure, Correct Position, site marked,  Risks and benefits discussed,  Surgical consent,  Pre-op evaluation,  At surgeon's request and post-op pain management  Laterality: Left and Lower  Prep: chloraprep       Needles:  Injection technique: Single-shot  Needle Type: Stimiplex     Needle Length: 9cm  Needle Gauge: 22     Additional Needles:   Procedures:,,,, ultrasound used (permanent image in chart),,    Narrative:  Start time: 08/14/2021 7:16 AM End time: 08/14/2021 7:17 AM Injection made incrementally with aspirations every 20 mL.  Performed by: Personally  Anesthesiologist: Foye Deer, MD  Additional Notes: Patient consented for risk and benefits of nerve block including but not limited to nerve damage, failed block, bleeding and infection.  Patient voiced understanding.  Functioning IV was confirmed and monitors were applied.  Timeout done prior to procedure and prior to any sedation being given to the patient.  Patient confirmed procedure site prior to any sedation given to the patient.  A 13mm 22ga Stimuplex needle was used. Sterile prep,hand hygiene and sterile gloves were used.  Minimal sedation used for procedure.  No paresthesia endorsed by patient during the procedure.  Negative aspiration and negative test dose prior to incremental administration of local anesthetic. The patient tolerated the procedure well with no immediate complications.

## 2021-08-14 NOTE — Discharge Instructions (Signed)
Post-Op Instructions  1. Bracing or crutches: You will be provided with a long brace (from hip to ankle) and crutches at the surgery center.   2. Ice: You will be provided with a device [facility]) that allows you to ice the affected area effectively.   3. Showering: Incision must remain dry for 5 days. Afterwards, you may shower and gently pat incision dry. NO submerging wound for 4 weeks. Any stitches that need to be removed will be done so at first postop visit.   4. Driving: You will be given specific driving precautions at discharge. Plan on not driving for at least one week for left knee surgery, and 4 weeks for right knee surgery if you are restricted due to the brace and knee motion. Please note that you are advised NOT to drive while taking narcotic pain medications as you may be impaired and unsafe to drive.  5. Activity: Weight bearing: No weight bearing on the affected leg for 4 weeks, then 50% weight bearing for 2 weeks, then full weight bearing at 6 weeks. Bending the knee is limited and will be guided by the physical therapist. Elevate knee above heart level as much as possible for one week. Avoid standing more than 5 minutes (consecutively) for the first week. No exercise involving the knee until cleared by the surgeon or physical therapist.  Avoid long distance travel for 4 weeks.  6. Medications: - You have been provided a prescription for narcotic pain medicine. After surgery, take 1-2 narcotic tablets every 4 hours if needed for severe pain.  - A prescription for anti-nausea medication will be provided in case the narcotic medicine causes nausea - take 1 tablet every 6 hours only if nauseated.  - Take enteric coated aspirin 325 mg once daily for 4 weeks to prevent blood clots.  -Take tylenol 1000 every 8 hours for pain.  May stop tylenol 3 days after surgery or when you are having minimal pain. -DO NOT TAKE IBUPROFEN, ALEVE or OTHER NSAIDs as they can interfere with bone  healing.  -Take Citracal Maximum Strength (calcium citrate + vitamin D), 2 tabs daily.  If you are taking prescription medication for anxiety, depression, insomnia, muscle spasm, chronic pain, or for attention deficit disorder, you are advised that you are at a higher risk of adverse effects with use of narcotics post-op, including narcotic addiction/dependence, depressed breathing, death. If you use non-prescribed substances: alcohol, marijuana, cocaine, heroin, methamphetamines, etc., you are at a higher risk of adverse effects with use of narcotics post-op, including narcotic addiction/dependence, depressed breathing, death. You are advised that taking > 50 morphine milligram equivalents (MME) of narcotic pain medication per day results in twice the risk of overdose or death. For your prescription provided: oxycodone 5 mg - taking more than 6 tablets per day would result in > 50 morphine milligram equivalents (MME) of narcotic pain medication. Be advised that we will prescribe narcotics short-term, for acute post-operative pain, only 3 weeks for major operations such as knee repair/reconstruction surgeries.   7. Bandages: The physical therapist should change the bandages at the first post-op appointment. If needed, the dressing supplies have been provided to you.  8. Physical Therapy: 1-2 times per week for the first 8 weeks post-op. Based on progress, may drop to once weekly afterwards. Therapy typically starts on post operative Day 3 or 4. You have been provided an order for physical therapy today and should schedule your appointments in advance to avoid delay. The therapist will  provide home exercises.  9. Work or School: For most, but not all procedures, we advise staying out of work or school for at least 1 to 2 weeks in order to recover from the stress of surgery and to allow time for healing and swelling control. If you need a work or school note this can be provided.   10. Post-Op  Appointments: Your first PT visit should be in the first week after surgery. Post-op appointment with Dr. Posey Pronto will be in approximately 2 weeks time. Please double check if this will be at the [facility] facility (Tuesdays and Thursdays) or [place] facility (Wednesdays).    If you find that they have not been scheduled please call the Orthopaedic Appointment front desk at [phone].

## 2021-08-14 NOTE — Evaluation (Signed)
Physical Therapy Evaluation Patient Details Name: Jacqueline Burch MRN: [id number] DOB: 2000/11/17 Today's Date: 08/14/2021  History of Present Illness  Pt is s/p elective L knee MPFL reconstruction and tibial tubercle osteotomy  Clinical Impression  Pt admitted with above diagnosis. Pt received upright in bed with family present. Agreeable to PT services. Reports pins and needles in LLE. Pt at baseline reports complete independence in mobility, ADL's/IADL's. Pt able to transfer to EoB mod-I with bed slightly elevated. Pt re-educated on NWB precautions on LLE prior to standing with PT demo on safe use of DME. Pt standing with minguard with ability to step pivot on LLE with increased time and correct use of RW. Pt able to take two small hops posteriorly to get closer to recliner and then sit safely with VC's for hand placement. Anticipate in following sessions if pt can successfully hop to with RW household distances and asc/desc stairs to enter home, pt safe to d/c home. PT to follow physician d/c recs. Anticipate pt will require OP PT services at discharge for AROM, strengthening, and progressive gait to return to PLOF. Pt in recliner with all needs in reach. Pt currently with functional limitations due to the deficits listed below (see PT Problem List). Pt will benefit from skilled PT to increase their independence and safety with mobility to allow discharge to the venue listed below.      Recommendations for follow up therapy are one component of a multi-disciplinary discharge planning process, led by the attending physician.  Recommendations may be updated based on patient status, additional functional criteria and insurance authorization.  Follow Up Recommendations Follow physician's recommendations for discharge plan and follow up therapies    Assistance Recommended at Discharge Intermittent Supervision/Assistance  Patient can return home with the following       Equipment Recommendations  None recommended by PT  Recommendations for Other Services       Functional Status Assessment Patient has had a recent decline in their functional status and demonstrates the ability to make significant improvements in function in a reasonable and predictable amount of time.     Precautions / Restrictions Precautions Precautions: Knee Precaution Booklet Issued: No Required Braces or Orthoses: Other Brace Other Brace: Hinge brace locked in extension Restrictions Weight Bearing Restrictions: Yes LLE Weight Bearing: Non weight bearing      Mobility  Bed Mobility Overal bed mobility: Modified Independent               Patient Response: Cooperative  Transfers Overall transfer level: Needs assistance Equipment used: Rolling walker (2 wheels) Transfers: Sit to/from Stand, Bed to chair/wheelchair/BSC Sit to Stand: Min guard   Step pivot transfers: Min guard            Ambulation/Gait                  Stairs            Wheelchair Mobility    Modified Rankin (Stroke Patients Only)       Balance Overall balance assessment: Needs assistance Sitting-balance support: Bilateral upper extremity supported, Feet unsupported Sitting balance-Leahy Scale: Good       Standing balance-Leahy Scale: Fair Standing balance comment: reliant on AD                             Pertinent Vitals/Pain Pain Assessment Pain Assessment: Faces Faces Pain Scale: Hurts a little bit Pain Location: L knee in dependent position  Pain Descriptors / Indicators: Discomfort, Pins and needles Pain Intervention(s): Limited activity within patient's tolerance, Monitored during session, Repositioned    Home Living Family/patient expects to be discharged to:: Private residence Living Arrangements: Parent;Other relatives Available Help at Discharge: Family;Available 24 hours/day Type of Home: House Home Access: Stairs to enter Entrance Stairs-Rails: None Entrance  Stairs-Number of Steps: 3   Home Layout: One level Home Equipment: Agricultural consultant (2 wheels)      Prior Function Prior Level of Function : Independent/Modified Independent                     Hand Dominance        Extremity/Trunk Assessment   Upper Extremity Assessment Upper Extremity Assessment: Overall WFL for tasks assessed    Lower Extremity Assessment Lower Extremity Assessment: LLE deficits/detail LLE Deficits / Details: NWB on LLE LLE Sensation: decreased light touch    Cervical / Trunk Assessment Cervical / Trunk Assessment: Normal  Communication   Communication: No difficulties  Cognition Arousal/Alertness: Awake/alert Behavior During Therapy: WFL for tasks assessed/performed Overall Cognitive Status: Within Functional Limits for tasks assessed                                          General Comments      Exercises Other Exercises Other Exercises: Role of PT in acute setting, WB precautions, d/c recs, safe use of RW   Assessment/Plan    PT Assessment Patient needs continued PT services  PT Problem List Decreased strength;Decreased mobility;Decreased activity tolerance;Decreased balance;Pain       PT Treatment Interventions DME instruction;Therapeutic exercise;Gait training;Balance training;Stair training;Neuromuscular re-education;Functional mobility training;Therapeutic activities;Patient/family education    PT Goals (Current goals can be found in the Care Plan section)  Acute Rehab PT Goals Patient Stated Goal: to improve mobility and go home PT Goal Formulation: With patient Time For Goal Achievement: 08/28/21 Potential to Achieve Goals: Good    Frequency BID     Co-evaluation               AM-PAC PT "6 Clicks" Mobility  Outcome Measure Help needed turning from your back to your side while in a flat bed without using bedrails?: None Help needed moving from lying on your back to sitting on the side of a flat  bed without using bedrails?: None Help needed moving to and from a bed to a chair (including a wheelchair)?: A Little Help needed standing up from a chair using your arms (e.g., wheelchair or bedside chair)?: A Little Help needed to walk in hospital room?: A Lot Help needed climbing 3-5 steps with a railing? : A Lot 6 Click Score: 18    End of Session Equipment Utilized During Treatment: Gait belt Activity Tolerance: Patient tolerated treatment well Patient left: in chair;with call bell/phone within reach;with family/visitor present Nurse Communication: Mobility status PT Visit Diagnosis: Muscle weakness (generalized) (M62.81);Other abnormalities of gait and mobility (R26.89);Difficulty in walking, not elsewhere classified (R26.2)    Time: 5573-2202 PT Time Calculation (min) (ACUTE ONLY): 27 min   Charges:   PT Evaluation $PT Eval Low Complexity: 1 Low PT Treatments $Therapeutic Activity: 8-22 mins        Alyaan Budzynski M. Fairly IV, PT, DPT Physical Therapist- [place]  [facility]  08/14/2021, 4:10 PM

## 2021-08-14 NOTE — H&P (Signed)
Paper H&P to be scanned into permanent record. H&P reviewed. No significant changes noted.  

## 2021-08-14 NOTE — Anesthesia Procedure Notes (Signed)
Procedure Name: Intubation Date/Time: 08/14/2021 7:44 AM  Performed by: Biagio Borg, CRNAPre-anesthesia Checklist: Patient identified, Emergency Drugs available, Suction available and Patient being monitored Patient Re-evaluated:Patient Re-evaluated prior to induction Oxygen Delivery Method: Circle system utilized Preoxygenation: Pre-oxygenation with 100% oxygen Induction Type: IV induction Ventilation: Mask ventilation without difficulty Laryngoscope Size: McGraph and 3 Grade View: Grade I Tube type: Oral Number of attempts: 1 Airway Equipment and Method: Stylet Placement Confirmation: ETT inserted through vocal cords under direct vision, positive ETCO2 and breath sounds checked- equal and bilateral Secured at: 20 cm Tube secured with: Tape Dental Injury: Teeth and Oropharynx as per pre-operative assessment

## 2021-08-14 NOTE — Op Note (Signed)
OPERATIVE NOTE  SURGERY DATE: 08/14/2021  PRE-OP DIAGNOSIS:  1. Left patella dislocation 2. Left knee patellofemoral misalignment 3. 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 Left patella dislocation 2. Left knee patellofemoral misalignment 3. 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 Left knee tibial tubercle osteotomy (distalization and anteromedialization) 2. Left knee medial patellofemoral ligament reconstruction using allograft 3. Left knee arthroscopic lateral retinacular release  SURGEON:Shama Monfils Jearld Lesch, MD  ASSISTANT(S): Reche Dixon, [place]  ANESTHESIA: Gen + regional anesthesia  TOTAL IV FLUIDS: see anesthesia record  ESTIMATED BLOOD LOSS: 50cc  TOURNIQUET TIME: 120 min  DRAINS: medium 10 Fr Hemovac  SPECIMENS: None.  IMPLANTS:  - 4.52m cortical screws x 3 - Qfix double loaded suture anchor x 1 - Qfix Mini suture anchors x 2 - Semitendinosus allograft  COMPLICATIONS: None apparent.  INDICATIONS: Jacqueline Burch a 21y.o. female with knee pain and patellar instability.  The patient had patellar malalignment with TT-TG distance of 19 mm and patella alta with a Caton-Dechamps ratio of 1.42.  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  She has had multiple patellar dislocations with minimal trauma with last one occurring while she was simply standing.  Given these findings, and failure of nonoperative management, surgery was recommended for tibial tubercle osteotomy to improve patella alignment, correct patella alta, and unload the patellofemoral joint as well as MPFL reconstruction to reduce chance of recurrent patellar dislocation. The patient also had lateral tilt of the patella so lateral release was indicated.  After discussion of risks, benefits, and alternatives to surgery, the patient and her mother elected to proceed.    OPERATIVE  FINDINGS:    Examination under anesthesia: A careful examination under anesthesia was performed.  Passive range of motion was: Hyperextension: 2.  Extension: 0.  Flexion: 125.  Lachman: normal. Pivot Shift: normal.  Posterior drawer: normal.  Varus stability in full extension: normal.  Varus stability in 30 degrees of flexion: normal.  Valgus stability in full extension: normal.  Valgus stability in 30 degrees of flexion: normal. Patella: 4 quadrants lateral mobility with ability to dislocate patella, 2 quadrants medial mobility, lateral patellar tracking, and lateral tilt   Intra-operative findings: A thorough arthroscopic examination of the knee was performed.  The findings are: 1. Suprapatellar pouch:  normal 2. Undersurface of median ridge: Grade 1 softening diffusely 3. Medial patellar facet: Grade 1 softening 4. Lateral patellar facet: Normal, significant overhang 5. Trochlea: Dysplastic with supratrochlear spur present 6. Lateral gutter/popliteus tendon: Normal 7. Hoffa's fat pad: Normal 8. Medial gutter/plica: Normal 9. ACL: Normal 10. PCL: Normal 11. Medial meniscus: Normal 12. Medial compartment cartilage: Normal 13. Lateral meniscus: Normal 14. Lateral compartment cartilage: Normal  DETAILS OF PROCEDURE: The patient was identified in the preoperative holding area and the appropriate operative extremity was marked.  Informed consent was again verified with the patient and parents.  The patient was then brought to the operating room and placed supine on the OR table.  A tourniquet was placed on the thigh.  All bony prominences were well-padded and the arms were in a neutral position.  The operative extremity was prescrubbed with Hibiclens and alcohol, prepped with ChloraPrep, and draped in the usual sterile fashion. The patient was given preoperative IV antibiotics and a surgical time-out confirming patient identity, procedure, and laterality was performed.   We first completed the  knee  arthroscopy portion of the procedure.  A standard anterolateral portal was made with an 11 blade. A standard anteromedial portal was made using needle localization in a similar fashion. A diagnostic arthroscopy was performed with the above findings. Given significant lateral patellar tilt, a lateral release was performed using an Arthrocare wand. This extended from just distal to the vastus lateralis tendon insertion to the anterolateral portal. This significantly improved the patellar tilt.   An Esmarch bandage was used to exsanguinate the leg and tourniquet was inflated. Next, the tibial tubercle osteotomy was performed.  A midline incision from the proximal tibial tubercle was extended to ~8cm distal to the tibial tubercle.  Medial and lateral flaps were developed. The anterior compartment musculature was released subperiosteally and protected.  Two guidepins were placed at an ~45 degree angle to estimate the trajectory of the osteotomy.  Once this was satisfactory, an oscillating saw was used to make the cut.  Osteotomes were used proximally to finish the lateral and medial cuts on the shingle.  The proximal aspect of the tibial tubercle was then elevated.  The distal shingle was then cut and approximately 23m of the shingle was removed to allow for 126mdistalization.  The tibial tubercle was then also shifted medially ~1057m Two guidepins were used to hold the osteotomy in place.  The knee was placed in 30 degrees knee flexion.  Fluoroscopy was used to verify appropriate distalization and correction of patella alta.  Three fully-threaded cortical screws, 4.5 mm in diameter were then placed in a lag fashion. The countersink was used to limit the prominence of the screw heads.  3 screws were utilized instead of the standard 2 screws given that the distal hinge was no longer intact due to the distalization.  Position of osteotomy and hardware was confirmed fluoroscopically. Bone wax was used to cover the  non-healing bony surfaces to limit cancellous bleeding. There was no unusual bleeding.  A medium Hemovac drain was placed along the tibial tubercle osteotomy site.   Next, the MPFL reconstruction was performed.  The knee was kept in 30 degrees knee flexion.  An incision was made over the superomedial aspect of the patella. Careful dissection was performed to identify layers 1 and 2. A blunt kelly clamp was then passed between layers 2 and 3 posteriorly towards the femoral origin of the MPFL, confirming that this was the appropriate layer. Using bovie electrocautery, dissection onto the medial surface of the patella was performed, stopping short of exposing the articular margin. A rongeur was used to create a trough at the midportion of the patella. Using fluoroscopy, two Qfix Mini all suture anchors were placed into the trough created on the patella. The first was placed at the midportion of the patella in the superior/inferior aspect, and the second was placed ~1.5cm superior near the angle of the patella. Next, Schottle's point was localized with fluoroscopy and an incision was made over it. Dissection was carried down with bovie electrocautery and fascia was incised longitudinally.  A drill guide for a Q fix double loaded anchor was placed over Schottle's point.  This was confirmed fluoroscopically.  Then, aiming approximately 20 degrees proximal and anterior to avoid the the intercondylar notch, the Q fix anchor was placed in this fashion.  The semitendinosus allograft was marked at the midportion.  One limb of each of the 2 sutures was placed above the graft and the other limb was placed below the graft.  A circumferential, loop stitch was tied on  either side of the central mark on the allograft.  This appropriately brought the graft to the anchor at Schottle's point.  A Kelly clamp was passed in between layers 2 and 3 to retreive both free ends of the graft. The graft limbs were held at a resting tension.  One limb of the superior patella anchor was passed in a [place] fashion through one free end of the graft and the second limb was passed through the graft in a simple fashion. This limb became the post and was used to shuttle the graft down to the anchor. This was then tied with alternating half-hitches. Similarly, this was performed for the more inferior suture anchor and the other free end of the graft. This achieved appropriate constraint of the patella. Patella could not be dislocated laterally afterwards.  All wounds were thoroughly irrigated. 3-0 Nylon sutures were used to close the arthroscopic portal incisions. 0-Vicryl sutures were used to close the split between layers 2 and 3. 2-0 Vicryl and 4-0 Monocryl with Dermabond were used to close the subdermal layer and skin of all remaining incisions. Leg was wrapped in cotton and bias wrap.  Polar Care and hinged knee brace locked at 0 degrees were applied.  The patient was brought to PACU in stable condition.  Instrument, sponge, and needle counts were correct prior to wound closure and at the conclusion of the case.   Of note, assistance from a PA was essential to performing the surgery.  PA was present for the entire surgery.  PA assisted with patient positioning, retraction, instrumentation, and wound closure. The surgery would have been more difficult and had longer operative time without PA assistance.    Of note, the tibial tubercle osteotomy and MPFL reconstruction had significantly added complexity.  Given the patient's weight, primarily located about the knee and leg region, there was extensive soft tissue dissection necessary.  Incision had to be made larger than usual to allow for appropriate visualization and completion of the procedure.  Additionally distalization portion of the tibial tubercle osteotomy necessitated use of appropriate fluoroscopy to ensure correct positioning as well as instrumentation to place a third fixation screw.  All  of these factors added approximately 30 minutes to the total surgical time for this procedure.         DISPOSITION: PACU - hemodynamically stable.  POSTOPERATIVE PLAN: The patient will be discharged home tomorrow after overnight stay pending PT evaluation.  Antibiotics will be given overnight, but discontinued within 24 hours of surgery.  Aspirin 325 mg/day x 4 weeks for DVT prophylaxis.  The patient will be non-weight bearing for 4 weeks, and then may 50% weight bear from weeks 4 to 6, and may fully weight bear at week 6.  Brace will be locked for ambulation for the first 6 weeks using 2 crutches.    Physical therapy to begin post-op day 3-4 for knee range of motion, patellar mobilization and scar mobilization.  CPM to start on POD#1.   Return to clinic in ~2 weeks.              REHAB PROTOCOL: Dr. Cato Mulligan, M.D.   PATELLAR REALIGNMENT PROTOCOL -  MPFL RECONSTRUCTION AND TIBIAL TUBERCLE OSTEOTOMY ________________________________________________________________________  GENERAL GUIDELINES: No closed kinetic chain exercise for 4 weeks The same rehabilitation protocol is followed for proximal and distal realignments with the exception of range of motion limitations as noted  GENERAL PROGRESSION OF ACTIVITIES OF DAILY LIVING: Patients may begin the following activities at the  dates indicated (unless otherwise specified by the physician): Bathing/showering after 1 week Sleep with brace locked in extension for 4 weeks Driving at 4 weeks post-op Brace locked in extension for 6 weeks for ambulation Use of crutches continued for 6-8 weeks post-op No weightbearing for 4 weeks, then 50% weight-bearing for 2 more weeks Full weight-bearing at 6 weeks  REHABILITATION PROGRESSION: The following is a general guideline for progression of the rehabilitation program following patellar realignment.  Progression through each phase should take into consideration patient status (e.g.  healing, function) and physician advisement.  Please consult the attending physician if there is uncertainty regarding the advancement of a patient to the next phase of rehabilitation.  PHASE I: Begins immediately post-op through approximately 6 weeks.  Goals: Protect fixation and surrounding soft tissue Control inflammatory process Regain active quadriceps and VMO control Minimize the adverse effects of immobilization through CPM and heel slides in the allowed range of motion Full knee extension Patient education regarding rehabilitation process  ROM: 0 - 4 weeks: 0 - 90 flexion  Brace: 0 - 4 weeks: Locked in full extension for all activities except therapeutic exercises and CPM use     Locked in full extension for sleeping 4 - 6 weeks: Unlock brace for sleeping, continue with brace locked in full extension for ambulation   Weightbearing Status: No weight-bearing for 4 weeks, then 50% weight-bearing for 2 more weeks Full weight-bearing at 6 weeks  Therapeutic Exercises: Extreme attention to patellar mobs in 4 directions, especially inferior and superior. Massage infrapatellar fat pad. Perform, but take caution with lateral mobs as to not stress MPFL reconstruction Quad sets and isometric adduction with biofeedback for VMO Heel slides from 0-90 of flexion  CPM for 8 hours daily. Week 1: 0-45 Week 2: 0-60 Week 3: 0-75 Week 4: 0-90 Non-weight bearing gastrocnemius/soleus hamstring stretches SLR in four planes with brace locked in full extension (can be performed in standing) Resisted ankle ROM with theraband Patellar mobilization (begin when tolerated by patient)  PHASE II: Begins approximately 6 weeks post-op and extends to approximately 8 weeks post-op.  Criteria for advancement to Phase II: Good quad set Approximately 90 of flexion No signs of active inflammation  Goals: Increase range of flexion Avoid overstressing fixation Increase quadriceps and VMO  control for restoration of proper patellar tracking.  Brace: 6 - 8 weeks: Discontinue use for sleeping, unlock for ambulation as allowed by physician.  Weightbearing Status: 6-8 weeks: As tolerated with two crutches if cleared.  Therapeutic Exercises: Continue exercises as noted above, progress towards full flexion with heel slides Progress to weight-bearing gastrocnemius/soleus stretching Discontinue CPM if knee flexion is at least 90 Begin aquatic therapy, emphasis on normalization of gait Balance exercises (e.g. single-leg standing, KAT) Remove brace for SLR exercises Stationary bike, low resistance, high seat Short arc quadriceps exercises in pain free ranges (0-20, 60-90 of flexion) emphasize movement quality Wall slides progressing to mini-squats, 0-45 of flexion  PHASE III: Begins approximately 8 weeks post-op and extends through approximately 4 months.  Criteria for advancement to Phase III: Good quadriceps tone and no extension lag with SLR Non-antalgic gait pattern Good dynamic patellar control with no evidence of lateral tracking or instability  Weightbearing Status: May discontinue use of crutches when the following criteria are met: No extension lag with SLR Full extension Non-antalgic gait pattern (may use one crutch or cane until gait is normalized)  Therapeutic Exercises: Stationary bike, add moderate resistance 4 way hip flexion, adduction, abduction, extension Leg  press 0-45 of flexion Closed kinetic chain terminal knee extension with resistive tubing or weight machine Swimming, Stairmaster for endurance Toe raises Hamstring curls Treadmill walking with emphasis on normalization of gait  Continue proprioception exercises Continue flexibility exercises for gastrocnemius/soleus and hamstrings, add iliotibial band and quadriceps as indicated At 3 months: Step-ups, begin at 2" and progress towards 8"  PHASE IV: Begins approximately 4 months post-op and  extends through approximately 6 months.  Criteria for advancement to Phase IV: Good to normal quadriceps strength No evidence of patellar instability No soft tissue complaints Normal gait pattern Clearance from physician to begin more concentrated closed kinetic chain exercises, and resume full or partial activity  Goals: Continue improvements in quadriceps strength Improve functional strength and proprioception Return to appropriate activity level  Therapeutic Exercises: Progression of closed kinetic chain activities Jogging in pool with wet vest or belt Functional progression, sport-specific activities or work hardening as appropriate    For any questions or concerns regarding the protocol or rehabilitation process please contact my office: [facility] Phone: [phone] Fax: [phone]

## 2021-08-14 NOTE — Transfer of Care (Signed)
Immediate Anesthesia Transfer of Care Note  Patient: Jacqueline Burch  Procedure(s) Performed: Left knee arthroscopy and medial patellofemoral ligament reconstruction using semitendinosus allograft, lateral release, and tibial tubercle osteotomy with distalization and anterior medialization (Left: Knee)  Patient Location: PACU  Anesthesia Type:General  Level of Consciousness: drowsy  Airway & Oxygen Therapy: Patient Spontanous Breathing  Post-op Assessment: Report given to RN and Post -op Vital signs reviewed and stable  Post vital signs: Reviewed and stable  Last Vitals:  Vitals Value Taken Time  BP 123/77 08/14/21 1119  Temp    Pulse 101 08/14/21 1122  Resp 20 08/14/21 1122  SpO2 96 % 08/14/21 1122  Vitals shown include unvalidated device data.  Last Pain:  Vitals:   08/14/21 0600  TempSrc: Oral  PainSc: 0-No pain         Complications: No notable events documented.

## 2021-08-14 NOTE — Anesthesia Postprocedure Evaluation (Signed)
Anesthesia Post Note  Patient: Emmalyn Hinson  Procedure(s) Performed: Left knee arthroscopy and medial patellofemoral ligament reconstruction using semitendinosus allograft, lateral release, and tibial tubercle osteotomy with distalization and anterior medialization (Left: Knee)  Patient location during evaluation: PACU Anesthesia Type: General Level of consciousness: awake and alert Pain management: pain level controlled Vital Signs Assessment: post-procedure vital signs reviewed and stable Respiratory status: spontaneous breathing, nonlabored ventilation, respiratory function stable and patient connected to nasal cannula oxygen Cardiovascular status: blood pressure returned to baseline and stable Postop Assessment: no apparent nausea or vomiting Anesthetic complications: no   No notable events documented.   Last Vitals:  Vitals:   08/14/21 1228 08/14/21 1300  BP: (!) 146/73   Pulse: (!) 114 (!) 102  Resp:    Temp:    SpO2:      Last Pain:  Vitals:   08/14/21 1204  TempSrc:   PainSc: 3                  Corinda Gubler

## 2021-08-14 NOTE — Plan of Care (Signed)

## 2021-08-14 NOTE — Anesthesia Procedure Notes (Signed)
Anesthesia Regional Block: Other (IPACK)   Pre-Anesthetic Checklist: , timeout performed,  Correct Patient, Correct Site, Correct Laterality,  Correct Procedure, Correct Position, site marked,  Risks and benefits discussed,  Surgical consent,  Pre-op evaluation,  At surgeon's request and post-op pain management  Laterality: Left and Lower  Prep: chloraprep       Needles:  Injection technique: Single-shot  Needle Type: Stimiplex     Needle Length: 9cm  Needle Gauge: 22     Additional Needles:   Procedures:,,,, ultrasound used (permanent image in chart),,    Narrative:  Start time: 08/14/2021 7:22 AM End time: 08/14/2021 7:25 AM Injection made incrementally with aspirations every 20 mL.  Performed by: Personally  Anesthesiologist: Foye Deer, MD  Additional Notes: Patient consented for risk and benefits of nerve block including but not limited to nerve damage, failed block, bleeding and infection.  Patient voiced understanding.  Functioning IV was confirmed and monitors were applied.  Timeout done prior to procedure and prior to any sedation being given to the patient.  Patient confirmed procedure site prior to any sedation given to the patient.  A 17mm 22ga Stimuplex needle was used. Sterile prep,hand hygiene and sterile gloves were used.  Minimal sedation used for procedure.  No paresthesia endorsed by patient during the procedure.  Negative aspiration and negative test dose prior to incremental administration of local anesthetic. The patient tolerated the procedure well with no immediate complications.

## 2021-08-15 ENCOUNTER — Encounter: Payer: Self-pay | Admitting: Orthopedic Surgery

## 2021-08-15 DIAGNOSIS — S83005A Unspecified dislocation of left patella, initial encounter: Secondary | ICD-10-CM | POA: Diagnosis not present

## 2021-08-15 LAB — HIV ANTIBODY (ROUTINE TESTING W REFLEX): HIV Screen 4th Generation wRfx: NONREACTIVE

## 2021-08-15 MED ORDER — ASPIRIN 325 MG PO TBEC: 325.0000 mg | DELAYED_RELEASE_TABLET | Freq: Every day | ORAL | 0 refills | Status: DC

## 2021-08-15 MED ORDER — ONDANSETRON HCL 4 MG PO TABS
4.0000 mg | ORAL_TABLET | Freq: Four times a day (QID) | ORAL | 0 refills | Status: DC | PRN
Start: 2021-08-15 — End: 2021-12-06

## 2021-08-15 MED ORDER — METHOCARBAMOL 500 MG PO TABS: 500.0000 mg | ORAL_TABLET | Freq: Four times a day (QID) | ORAL | 0 refills | Status: DC | PRN

## 2021-08-15 MED ORDER — OXYCODONE HCL 5 MG PO TABS: 5.0000 mg | ORAL_TABLET | ORAL | 0 refills | Status: DC | PRN

## 2021-08-15 NOTE — Progress Notes (Signed)
  Subjective: 1 Day Post-Op Procedure(s) (LRB): Left knee arthroscopy and medial patellofemoral ligament reconstruction using semitendinosus allograft, lateral release, and tibial tubercle osteotomy with distalization and anterior medialization (Left) Patient reports pain as mild.   Patient is well, and has had no acute complaints or problems Plan is to go Home after hospital stay. Negative for chest pain and shortness of breath Fever: no Gastrointestinal: Negative for nausea and vomiting  Objective: Vital signs in last 24 hours: Temp:  [97.2 F (36.2 C)-99.3 F (37.4 C)] 97.8 F (36.6 C) (06/13 0540) Pulse Rate:  [82-126] 82 (06/13 0540) Resp:  [15-28] 17 (06/13 0540) BP: (118-151)/(69-92) 118/70 (06/13 0540) SpO2:  [91 %-99 %] 99 % (06/13 0540)  Intake/Output from previous day:  Intake/Output Summary (Last 24 hours) at 08/15/2021 0720 Last data filed at 08/15/2021 0428 Gross per 24 hour  Intake 901337.06 ml  Output 5 ml  Net 901332.06 ml    Intake/Output this shift: No intake/output data recorded.  Labs: No results for input(s): "HGB" in the last 72 hours. No results for input(s): "WBC", "RBC", "HCT", "PLT" in the last 72 hours. No results for input(s): "NA", "K", "CL", "CO2", "BUN", "CREATININE", "GLUCOSE", "CALCIUM" in the last 72 hours. No results for input(s): "LABPT", "INR" in the last 72 hours.   EXAM General - Patient is Alert and Oriented Extremity - Neurovascular intact Sensation intact distally Dorsiflexion/Plantar flexion intact Compartment soft Dressing/Incision - clean, dry, no drainage Motor Function - intact, moving foot and toes well on exam.  Ambulated to the bathroom this morning.  Past Medical History:  Diagnosis Date   Anxiety    Insomnia    Insomnia     Assessment/Plan: 1 Day Post-Op Procedure(s) (LRB): Left knee arthroscopy and medial patellofemoral ligament reconstruction using semitendinosus allograft, lateral release, and tibial  tubercle osteotomy with distalization and anterior medialization (Left) Principal Problem:   Patellar dislocation  Estimated body mass index is 39.65 kg/m as calculated from the following:   Height as of this encounter: 5' (1.524 m).   Weight as of this encounter: 92.1 kg. Advance diet Up with therapy D/C IV fluids  DVT Prophylaxis - Aspirin Non-Weight-Bearing to left leg  Reche Dixon, PA-C Orthopaedic Surgery 08/15/2021, 7:20 AM/

## 2021-08-15 NOTE — Plan of Care (Signed)

## 2021-08-15 NOTE — TOC Progression Note (Signed)
Transition of Care [facility]) - Progression Note    Patient Details  Name: Jacqueline Burch MRN: [id number] Date of Birth: 2000/04/02  [facility] CM/SW [place], RN Phone Number: 08/15/2021, 10:48 AM  Clinical Narrative:     Patient needs a youth rolling walker and a shower seat, to be delivered to the bedside from [employer]        Expected Discharge Plan and Services           Expected Discharge Date: 08/15/21                                     Social Determinants of Health (SDOH) Interventions    Readmission Risk Interventions     No data to display

## 2021-08-15 NOTE — Discharge Summary (Addendum)
Physician Discharge Summary  Subjective: 2 Days Post-Op Procedure(s) (LRB): Left knee arthroscopy and medial patellofemoral ligament reconstruction using semitendinosus allograft, lateral release, and tibial tubercle osteotomy with distalization and anterior medialization (Left) Patient reports pain as mild.   Patient seen in rounds with Dr. Allena Katz. Patient is well, and has had no acute complaints or problems Patient is ready to go home with outpatient physical therapy  Physician Discharge Summary  Patient ID: Jacqueline Burch MRN: [id number] DOB/AGE: 06/21/2000 21 y.o.  Admit date: 08/14/2021 Discharge date: 08/16/2021  Admission Diagnoses:  Discharge Diagnoses:  Principal Problem:   Patellar dislocation   Discharged Condition: fair  Hospital Course: The patient is postop day 1 from a left knee arthroscopy with medial patellofemoral ligament reconstruction and tibial tubercle osteotomy.  She is doing well with pain control so far with the nerve block still working.  She has gotten up to the bathroom twice and has done physical therapy.  The patient ambulated 80 feet with physical therapy.  Treatments: surgery:  1. Left knee tibial tubercle osteotomy (distalization and anteromedialization) 2. Left knee medial patellofemoral ligament reconstruction using allograft 3. Left knee arthroscopic lateral retinacular release   SURGEON:Sunny Molinda Bailiff, MD   ASSISTANT(S): Dedra Skeens, [place]   ANESTHESIA: Gen + regional anesthesia   TOTAL IV FLUIDS: see anesthesia record   ESTIMATED BLOOD LOSS: 50cc   TOURNIQUET TIME: 120 min   DRAINS: medium 10 Fr Hemovac   SPECIMENS: None.   IMPLANTS:  - 4.32mm cortical screws x 3 - Qfix double loaded suture anchor x 1 - Qfix Mini suture anchors x 2 - Semitendinosus allograft   COMPLICATIONS: None apparent.  Discharge Exam: Blood pressure 121/76, pulse 71, temperature 97.9 F (36.6 C), resp. rate 20, height 5' (1.524 m), weight 92.1 kg, SpO2 98  %.   Disposition: Discharge disposition: 01-Home or Self Care        Allergies as of 08/16/2021   No Known Allergies      Medication List     TAKE these medications    aspirin EC 325 MG tablet Take 1 tablet (325 mg total) by mouth daily.   cholecalciferol 25 MCG (1000 UNIT) tablet Commonly known as: VITAMIN D3 Take 1,000 Units by mouth daily. Not sure of actual dosage   methocarbamol 500 MG tablet Commonly known as: ROBAXIN Take 1 tablet (500 mg total) by mouth every 6 (six) hours as needed for muscle spasms.   ondansetron 4 MG tablet Commonly known as: ZOFRAN Take 1 tablet (4 mg total) by mouth every 6 (six) hours as needed for nausea.   oxyCODONE 5 MG immediate release tablet Commonly known as: Oxy IR/ROXICODONE Take 1-2 tablets (5-10 mg total) by mouth every 4 (four) hours as needed for moderate pain (pain score 4-6).               Durable Medical Equipment  (From admission, onward)           Start     Ordered   08/15/21 1048  For home use only DME Walker rolling  Once       Comments: youth  Question Answer Comment  Walker: With 5 Inch Wheels   Patient needs a walker to treat with the following condition Difficulty walking      08/15/21 1048   08/15/21 0723  For home use only DME Shower stool  Once        08/15/21 0722            Follow-up  Information     Dedra Skeens, [place]. Go on 08/28/2021.   Specialty: Orthopedic Surgery Why: @2pm  For wound care Contact information: [address] [place] [phone]                 Signed: [phone], Angel Hobdy 08/16/2021, 7:18 AM   Objective: Vital signs in last 24 hours: Temp:  [97.9 F (36.6 C)-98.8 F (37.1 C)] 97.9 F (36.6 C) (06/14 0410) Pulse Rate:  [64-96] 71 (06/14 0410) Resp:  [16-20] 20 (06/14 0410) BP: (121-141)/(67-89) 121/76 (06/14 0410) SpO2:  [98 %-100 %] 98 % (06/14 0410)  Intake/Output from previous day: No intake or output  data in the 24 hours ending 08/16/21 0718   Intake/Output this shift: No intake/output data recorded.  Labs: No results for input(s): "HGB" in the last 72 hours. No results for input(s): "WBC", "RBC", "HCT", "PLT" in the last 72 hours. No results for input(s): "NA", "K", "CL", "CO2", "BUN", "CREATININE", "GLUCOSE", "CALCIUM" in the last 72 hours. No results for input(s): "LABPT", "INR" in the last 72 hours.  EXAM: General - Patient is Alert and Oriented Extremity - Neurovascular intact Sensation intact distally Dorsiflexion/Plantar flexion intact Compartment soft Incision - clean, dry, no drainage Motor Function -plantarflexion and dorsiflexion are intact.  Assessment/Plan: 2 Days Post-Op Procedure(s) (LRB): Left knee arthroscopy and medial patellofemoral ligament reconstruction using semitendinosus allograft, lateral release, and tibial tubercle osteotomy with distalization and anterior medialization (Left) Procedure(s) (LRB): Left knee arthroscopy and medial patellofemoral ligament reconstruction using semitendinosus allograft, lateral release, and tibial tubercle osteotomy with distalization and anterior medialization (Left) Past Medical History:  Diagnosis Date   Anxiety    Insomnia    Insomnia    Principal Problem:   Patellar dislocation  Estimated body mass index is 39.65 kg/m as calculated from the following:   Height as of this encounter: 5' (1.524 m).   Weight as of this encounter: 92.1 kg. Advance diet Up with therapy D/C IV fluids Diet - Regular diet Follow up - in 2 weeks Activity - NWB Disposition - Home Condition Upon Discharge - Stable DVT Prophylaxis - Aspirin  07-31-1992, PA-C Orthopaedic Surgery 08/16/2021, 7:18 AM

## 2021-08-15 NOTE — Progress Notes (Signed)
Physical Therapy Treatment Patient Details Name: Jacqueline Burch MRN: [id number] DOB: 10/03/2000 Today's Date: 08/15/2021   History of Present Illness Pt is s/p elective L knee MPFL reconstruction and tibial tubercle osteotomy    PT Comments    Pt received upright in bed with mother in room. Beginning of session with education and trouble shooting of various options for safe entering and exiting of home (I.e. rail/ramp installation, seated boosting technique, w/c technique). Pt/family and PT in agreement with home set up, safest option is to trial seated and boosting up stairs and attempt descending with hop to and L rail with RUE supported by family. Pt remains supervision with transfer from EOB to recliner and transported to stairs. PT demo prior to pt attempts for visual performance to assist in learning and expectation of safe completion. Pt able to safely transfer from recliner to sitting at bottom of stairs. Pt not able to generate enough leverage with RLE and BUE's on steps to successfully boost herself up to next step even with PT assist with hand to provide additional leverage. Education provided on coordinating RLE pushing when pushing with BUE's to assist in glut clearance without success. With seated rest, attempted posterior hop with RW to step but pt at very high risk for falls requiring modA to correct LOB with attempt. Pt endorsing unsafe and not comfortable with attempting this technique. Education provided to pt and family that at this rate ramp installment is safest option to get pt in and out of home safely. Education provided on AD compliant on 1:12 ratio for rise over run. Pt's mother verbalizing understanding. Pt returned to room transferring to supine mod-I with all needs in reach. MD notified. Will continue to follow physician recs for D/c but anticipate ramp installment, pt will be safe to d/c home.   Recommendations for follow up therapy are one component of a  multi-disciplinary discharge planning process, led by the attending physician.  Recommendations may be updated based on patient status, additional functional criteria and insurance authorization.  Follow Up Recommendations  Follow physician's recommendations for discharge plan and follow up therapies     Assistance Recommended at Discharge Intermittent Supervision/Assistance  Patient can return home with the following     Equipment Recommendations  None recommended by PT    Recommendations for Other Services       Precautions / Restrictions Precautions Precautions: Knee Precaution Booklet Issued: No Required Braces or Orthoses: Other Brace Other Brace: Hinge brace locked in extension Restrictions Weight Bearing Restrictions: Yes LLE Weight Bearing: Non weight bearing Other Position/Activity Restrictions: Per secure chat with Dr. Allena Katz on 6/13 pt is to weak brace locked in extension at all time. PROM only with therapy to 30 degrees.     Mobility  Bed Mobility Overal bed mobility: Modified Independent               Patient Response: Cooperative  Transfers Overall transfer level: Needs assistance Equipment used: Rolling walker (2 wheels) Transfers: Sit to/from Stand, Bed to chair/wheelchair/BSC Sit to Stand: Supervision   Step pivot transfers: Supervision       General transfer comment: with RW    Ambulation/Gait Ambulation/Gait assistance: Supervision Gait Distance (Feet): 80 Feet Assistive device: Rolling walker (2 wheels) Gait Pattern/deviations:  (hop-to gait with NWB on LLE)       General Gait Details: steady completion of hop-to gait with RW without balance deficits   Stairs Stairs: Yes Stairs assistance: Min guard Stair Management: No rails, Seated/boosting, Backwards, With  walker Number of Stairs: 1 General stair comments: Pt unsuccessful in trialing seated boosting technique and with posterior hopping backwards with RW.   Wheelchair  Mobility    Modified Rankin (Stroke Patients Only)       Balance Overall balance assessment: Needs assistance Sitting-balance support: Bilateral upper extremity supported, Feet unsupported Sitting balance-Leahy Scale: Good       Standing balance-Leahy Scale: Fair Standing balance comment: reliant on AD                            Cognition Arousal/Alertness: Awake/alert Behavior During Therapy: WFL for tasks assessed/performed Overall Cognitive Status: Within Functional Limits for tasks assessed                                 General Comments: Became anxious with PT demo of stair completion with AD        Exercises Other Exercises Other Exercises: Education with pt and mother on pt's need for ramp installation for safe ability to enter/exit home    General Comments        Pertinent Vitals/Pain Pain Assessment Pain Assessment: Faces Pain Score: 3  Faces Pain Scale: Hurts a little bit Pain Location: L knee Pain Descriptors / Indicators: Aching, Discomfort Pain Intervention(s): Limited activity within patient's tolerance, Monitored during session, Repositioned    Home Living Family/patient expects to be discharged to:: Private residence Living Arrangements: Parent;Other relatives Available Help at Discharge: Family;Available 24 hours/day Type of Home: House Home Access: Stairs to enter Entrance Stairs-Rails: None Entrance Stairs-Number of Steps: 3   Home Layout: One level Home Equipment: Agricultural consultantolling Walker (2 wheels)      Prior Function            PT Goals (current goals can now be found in the care plan section) Acute Rehab PT Goals Patient Stated Goal: to improve mobility and go home PT Goal Formulation: With patient Time For Goal Achievement: 08/28/21 Potential to Achieve Goals: Good Progress towards PT goals: Progressing toward goals    Frequency    BID      PT Plan Current plan remains appropriate     Co-evaluation              AM-PAC PT "6 Clicks" Mobility   Outcome Measure  Help needed turning from your back to your side while in a flat bed without using bedrails?: None Help needed moving from lying on your back to sitting on the side of a flat bed without using bedrails?: None Help needed moving to and from a bed to a chair (including a wheelchair)?: A Little Help needed standing up from a chair using your arms (e.g., wheelchair or bedside chair)?: A Little Help needed to walk in hospital room?: A Lot Help needed climbing 3-5 steps with a railing? : Total 6 Click Score: 17    End of Session Equipment Utilized During Treatment: Gait belt Activity Tolerance: Patient tolerated treatment well Patient left: in bed;with bed alarm set;with family/visitor present Nurse Communication: Mobility status PT Visit Diagnosis: Muscle weakness (generalized) (M62.81);Other abnormalities of gait and mobility (R26.89);Difficulty in walking, not elsewhere classified (R26.2)     Time: [id number] PT Time Calculation (min) (ACUTE ONLY): 41 min  Charges:  $Gait Training: 23-37 mins                    Delphia GratesMilton M. Fairly IV,  PT, DPT Physical Therapist- [facility]  08/15/2021, 2:31 PM

## 2021-08-15 NOTE — Progress Notes (Signed)
Physical Therapy Treatment Patient Details Name: Jacqueline Burch MRN: [id number] DOB: 01-16-2001 Today's Date: 08/15/2021   History of Present Illness Pt is s/p elective L knee MPFL reconstruction and tibial tubercle osteotomy    PT Comments    Pt received upright in bed agreeable to PT treatment with mother present in room. Pt mod-I for bed mobility and supervision to stand to RW with safe hand placement maintaining LLE WB precautions. With knee brace in extension. Pt able to perform safe hop-to gait pattern at supervision level ~80' with chair follow. Intermittent standing rest breaks required due to fatigue in UE's and discomfort in hands from WB on RW. Pt also required single seated rest break but pt easily and safely completing needed household distances to successfully navigate at home. After 63' pt transported in recliner to stairs. PT demonstrated posterior ascending and anterior descending with RW. Pt becomes tearful and appearing a little anxious and nervous after PT demo of stairs. Does report slightly fearful after seeing how to successfully manage stairs. PT provided supportive listening and reassurance. Education provided to pt and mother on other stair options such as ascending on buttocks up the stairs if needed and installation of railings to assist in successful stair completion. Plan to attempt stairs in p.m. session after family discusses options for safe stair completion prior to D/c. Pt in recliner in room post session with all needs in reach.    Recommendations for follow up therapy are one component of a multi-disciplinary discharge planning process, led by the attending physician.  Recommendations may be updated based on patient status, additional functional criteria and insurance authorization.  Follow Up Recommendations  Follow physician's recommendations for discharge plan and follow up therapies     Assistance Recommended at Discharge Intermittent  Supervision/Assistance  Patient can return home with the following     Equipment Recommendations  None recommended by PT    Recommendations for Other Services       Precautions / Restrictions Precautions Precautions: Knee Precaution Booklet Issued: No Required Braces or Orthoses: Other Brace Other Brace: Hinge brace locked in extension Restrictions Weight Bearing Restrictions: Yes LLE Weight Bearing: Non weight bearing     Mobility  Bed Mobility Overal bed mobility: Modified Independent               Patient Response: Cooperative, Anxious  Transfers Overall transfer level: Needs assistance Equipment used: Rolling walker (2 wheels) Transfers: Sit to/from Stand Sit to Stand: Supervision                Ambulation/Gait Ambulation/Gait assistance: Supervision Gait Distance (Feet): 80 Feet Assistive device: Rolling walker (2 wheels) Gait Pattern/deviations:  (hop-to gait with NWB on LLE)       General Gait Details: steady completion of hop-to gait with RW without balance deficits   Stairs             Wheelchair Mobility    Modified Rankin (Stroke Patients Only)       Balance Overall balance assessment: Needs assistance Sitting-balance support: Bilateral upper extremity supported, Feet unsupported         Standing balance-Leahy Scale: Fair                              Cognition Arousal/Alertness: Awake/alert Behavior During Therapy: WFL for tasks assessed/performed Overall Cognitive Status: Within Functional Limits for tasks assessed  General Comments: Became anxious with PT demo of stair completion with AD        Exercises Other Exercises Other Exercises: Education to pt and mother on strategies for safe stair completion    General Comments        Pertinent Vitals/Pain Pain Assessment Pain Assessment: 0-10 Pain Score: 3  Pain Location: L knee post hop to gait Pain  Descriptors / Indicators: Sharp, Grimacing, Crying Pain Intervention(s): Limited activity within patient's tolerance, Monitored during session, Repositioned, Ice applied    Home Living                          Prior Function            PT Goals (current goals can now be found in the care plan section) Acute Rehab PT Goals Patient Stated Goal: to improve mobility and go home PT Goal Formulation: With patient Time For Goal Achievement: 08/28/21 Potential to Achieve Goals: Good Progress towards PT goals: Progressing toward goals    Frequency    BID      PT Plan Current plan remains appropriate    Co-evaluation              AM-PAC PT "6 Clicks" Mobility   Outcome Measure  Help needed turning from your back to your side while in a flat bed without using bedrails?: None Help needed moving from lying on your back to sitting on the side of a flat bed without using bedrails?: None Help needed moving to and from a bed to a chair (including a wheelchair)?: A Little Help needed standing up from a chair using your arms (e.g., wheelchair or bedside chair)?: A Little Help needed to walk in hospital room?: A Little Help needed climbing 3-5 steps with a railing? : Total 6 Click Score: 18    End of Session Equipment Utilized During Treatment: Gait belt Activity Tolerance: Patient tolerated treatment well Patient left: in chair;with call bell/phone within reach;with family/visitor present Nurse Communication: Mobility status PT Visit Diagnosis: Muscle weakness (generalized) (M62.81);Other abnormalities of gait and mobility (R26.89);Difficulty in walking, not elsewhere classified (R26.2)     Time: 3557-3220 PT Time Calculation (min) (ACUTE ONLY): 36 min  Charges:  $Gait Training: 23-37 mins                     Delphia Grates. Fairly IV, PT, DPT Physical Therapist- [place]  [facility]  08/15/2021, 10:46 AM

## 2021-08-15 NOTE — Evaluation (Signed)
Occupational Therapy Evaluation Patient Details Name: Jacqueline Burch MRN: [id number] DOB: 2000/09/14 Today's Date: 08/15/2021   History of Present Illness Pt is s/p elective L knee MPFL reconstruction and tibial tubercle osteotomy   Clinical Impression   Patient presenting with decreased Ind in self care,balance, functional mobility/transfer, endurance, and knowledge of precautions. Patient is independent at baseline and living with family. Family able to assist as needed at discharge. Patient seated in recliner chair and reporting increased pain from prior therapy session. OT educated pt and family on techniques to increase Ind in LB self care with pt able to demonstrate threading pants onto L foot with brace on in extension. However, pt declined to stand to finishing donning LB clothing items. OT also recommended use of dress or gown at home to conserve energy. Pt has high bed at home and discussed the option of removing box springs in order to safety transfer on/off at home. OT also reviewing polar care with family reporting understanding. Greatest barrier at this time will be steps to enter home. Patient will benefit from acute OT to increase overall independence in the areas of ADLs, functional mobility, and safety awareness in order to safely discharge home with family.      Recommendations for follow up therapy are one component of a multi-disciplinary discharge planning process, led by the attending physician.  Recommendations may be updated based on patient status, additional functional criteria and insurance authorization.   Follow Up Recommendations  No OT follow up    Assistance Recommended at Discharge Frequent or constant Supervision/Assistance  Patient can return home with the following A little help with walking and/or transfers;A lot of help with bathing/dressing/bathroom;Help with stairs or ramp for entrance;Assistance with cooking/housework;Assist for transportation     Functional Status Assessment  Patient has had a recent decline in their functional status and demonstrates the ability to make significant improvements in function in a reasonable and predictable amount of time.  Equipment Recommendations  BSC/3in1       Precautions / Restrictions Precautions Precautions: Knee Precaution Booklet Issued: No Required Braces or Orthoses: Other Brace Other Brace: Hinge brace locked in extension Restrictions Weight Bearing Restrictions: Yes LLE Weight Bearing: Non weight bearing Other Position/Activity Restrictions: Per secure chat with Dr. Allena Katz on 6/13 pt is to weak brace locked in extension at all time. PROM only with therapy to 30 degrees.      Mobility Bed Mobility               General bed mobility comments: seated in recliner chair              Balance Overall balance assessment: Needs assistance Sitting-balance support: Bilateral upper extremity supported, Feet unsupported Sitting balance-Leahy Scale: Good                                     ADL either performed or assessed with clinical judgement   ADL Overall ADL's : Needs assistance/impaired                                       General ADL Comments: Pt is able to thread pants over L foot but unable to pull over brace without assistance.Pt declined standing tofully don secondary to increased pain. UB self care likely set up A. Education with pt and family on self  care strategies.     Vision Patient Visual Report: No change from baseline              Pertinent Vitals/Pain Pain Assessment Pain Assessment: Faces Faces Pain Scale: Hurts little more Pain Location: L knee Pain Descriptors / Indicators: Aching, Discomfort Pain Intervention(s): Limited activity within patient's tolerance, Monitored during session, Ice applied, Repositioned        Extremity/Trunk Assessment Upper Extremity Assessment Upper Extremity Assessment: Overall  WFL for tasks assessed   Lower Extremity Assessment Lower Extremity Assessment: Defer to PT evaluation       Communication Communication Communication: No difficulties   Cognition Arousal/Alertness: Awake/alert Behavior During Therapy: WFL for tasks assessed/performed Overall Cognitive Status: Within Functional Limits for tasks assessed                                                  Home Living Family/patient expects to be discharged to:: Private residence Living Arrangements: Parent;Other relatives Available Help at Discharge: Family;Available 24 hours/day Type of Home: House Home Access: Stairs to enter [employer] of Steps: 3 Entrance Stairs-Rails: None Home Layout: One level     Bathroom Shower/Tub: Chief Strategy Officer: Standard Bathroom Accessibility: Yes   Home Equipment: Agricultural consultant (2 wheels)          Prior Functioning/Environment Prior Level of Function : Independent/Modified Independent                        OT Problem List: Decreased strength;Decreased activity tolerance;Impaired balance (sitting and/or standing);Decreased safety awareness;Decreased knowledge of precautions;Decreased knowledge of use of DME or AE      OT Treatment/Interventions: Self-care/ADL training;Manual therapy;Therapeutic exercise;Patient/family education;Balance training;Energy conservation;Therapeutic activities;DME and/or AE instruction    OT Goals(Current goals can be found in the care plan section) Acute Rehab OT Goals Patient Stated Goal: to get better and go home OT Goal Formulation: With patient/family Time For Goal Achievement: 08/29/21 Potential to Achieve Goals: Good  OT Frequency: Min 2X/week       AM-PAC OT "6 Clicks" Daily Activity     Outcome Measure Help from another person eating meals?: None Help from another person taking care of personal grooming?: A Little Help from another person toileting,  which includes using toliet, bedpan, or urinal?: A Lot Help from another person bathing (including washing, rinsing, drying)?: A Lot Help from another person to put on and taking off regular upper body clothing?: None Help from another person to put on and taking off regular lower body clothing?: A Lot 6 Click Score: 17   End of Session Equipment Utilized During Treatment: Other (comment) (brace and polar care) Nurse Communication: Mobility status  Activity Tolerance: Patient tolerated treatment well Patient left: in chair;with call bell/phone within reach;with family/visitor present  OT Visit Diagnosis: Unsteadiness on feet (R26.81);Muscle weakness (generalized) (M62.81)                Time: 4696-2952 OT Time Calculation (min): 21 min Charges:  OT General Charges $OT Visit: 1 Visit OT Evaluation $OT Eval Moderate Complexity: 1 Mod OT Treatments $Self Care/Home Management : 8-22 mins  Jackquline Denmark, MS, OTR/L , CBIS ascom [phone]  08/15/21, 1:07 PM

## 2021-08-15 NOTE — TOC Initial Note (Signed)
Transition of Care [facility]) - Initial/Assessment Note    Patient Details  Name: Jacqueline Burch MRN: [id number] Date of Birth: September 21, 2000  [facility]) CM/SW Contact:    Marlowe Sax, RN Phone Number: 08/15/2021, 8:28 AM  Clinical Narrative:                   Transition of Care (TOC) Screening Note   Patient Details  Name: Jacqueline Burch Date of Birth: Oct 19, 2000   Transition of Care [facility]) CM/SW Contact:    Marlowe Sax, RN Phone Number: 08/15/2021, 8:28 AM  Patient set up with Outpatient PT  Transition of Care Department [facility]) has reviewed patient and no TOC needs have been identified at this time. We will continue to monitor patient advancement through interdisciplinary progression rounds. If new patient transition needs arise, please place a TOC consult.         Patient Goals and CMS Choice        Expected Discharge Plan and Services           Expected Discharge Date: 08/15/21                                    Prior Living Arrangements/Services                       Activities of Daily Living Home Assistive Devices/Equipment: Eyeglasses ADL Screening (condition at time of admission) Patient's cognitive ability adequate to safely complete daily activities?: Yes Is the patient deaf or have difficulty hearing?: No Does the patient have difficulty seeing, even when wearing glasses/contacts?: No Does the patient have difficulty concentrating, remembering, or making decisions?: No Patient able to express need for assistance with ADLs?: Yes Does the patient have difficulty dressing or bathing?: No Independently performs ADLs?: Yes (appropriate for developmental age) Does the patient have difficulty walking or climbing stairs?: No Weakness of Legs: None Weakness of Arms/Hands: None  Permission Sought/Granted                  Emotional Assessment              Admission diagnosis:  Patellar dislocation  [S83.006A] Patient Active Problem List   Diagnosis Date Noted   Patellar dislocation 08/14/2021   PCP:  Dan Humphreys, MD Pharmacy:   [facility] [address] [place] [facility] [address] [place] [address] Phone: [phone] Fax: [phone]     Social Determinants of Health (SDOH) Interventions    Readmission Risk Interventions     No data to display

## 2021-08-16 DIAGNOSIS — S83005A Unspecified dislocation of left patella, initial encounter: Secondary | ICD-10-CM | POA: Diagnosis not present

## 2021-08-16 NOTE — Progress Notes (Signed)
Physical Therapy Treatment Patient Details Name: Jacqueline Burch MRN: [id number] DOB: 05/13/2000 Today's Date: 08/16/2021   History of Present Illness Pt is s/p elective L knee MPFL reconstruction and tibial tubercle osteotomy    PT Comments    Pt received upright in bed agreeable to PT session. Pt reports minor pain in L knee. Does endorse some UE soreness from WB on RW. Education provided to pt and mother on car transfer techniques and use of shower chair for energy  conservation when hopping from car to ramp entrance at home. Pt and mother verbalizing udnerstanding of education.  Due to UE soreness and d/c this a.m. limited hopping distance perform for total of 16' with supervision. Pt remains stable and safe with RW maintaining NWB precautions on LLE throughout. Pt returning sitting EoB with reminder of using hand towels or pool noodles for hand cushion on RW handles due to reports of discomfort with WB through hands when hopping on RW. Pt safe to d/c home at this time.    Recommendations for follow up therapy are one component of a multi-disciplinary discharge planning process, led by the attending physician.  Recommendations may be updated based on patient status, additional functional criteria and insurance authorization.  Follow Up Recommendations  Follow physician's recommendations for discharge plan and follow up therapies     Assistance Recommended at Discharge Intermittent Supervision/Assistance  Patient can return home with the following     Equipment Recommendations  None recommended by PT    Recommendations for Other Services       Precautions / Restrictions Precautions Precautions: Knee Precaution Booklet Issued: No Required Braces or Orthoses: Other Brace Other Brace: Hinge brace locked in extension Restrictions Weight Bearing Restrictions: Yes LLE Weight Bearing: Non weight bearing Other Position/Activity Restrictions: Per secure chat with Dr. Allena Katz on 6/13 pt  is to weak brace locked in extension at all time. PROM only with therapy to 30 degrees.     Mobility  Bed Mobility Overal bed mobility: Modified Independent               Patient Response: Cooperative  Transfers Overall transfer level: Needs assistance Equipment used: Rolling walker (2 wheels) Transfers: Sit to/from Stand Sit to Stand: Supervision                Ambulation/Gait Ambulation/Gait assistance: Supervision Gait Distance (Feet): 18 Feet Assistive device: Rolling walker (2 wheels) Gait Pattern/deviations:  (hop-to)       General Gait Details: steady completion of hop-to gait with RW without balance deficits   Stairs             Wheelchair Mobility    Modified Rankin (Stroke Patients Only)       Balance Overall balance assessment: Needs assistance Sitting-balance support: Bilateral upper extremity supported, Feet unsupported Sitting balance-Leahy Scale: Good       Standing balance-Leahy Scale: Fair Standing balance comment: reliant on AD                            Cognition Arousal/Alertness: Awake/alert Behavior During Therapy: WFL for tasks assessed/performed Overall Cognitive Status: Within Functional Limits for tasks assessed                                          Exercises Other Exercises Other Exercises: Education on car transfers    General  Comments        Pertinent Vitals/Pain Pain Assessment Pain Assessment: 0-10 Pain Score: 2  Pain Location: L knee Pain Descriptors / Indicators: Aching, Discomfort Pain Intervention(s): Limited activity within patient's tolerance, Monitored during session, Repositioned, Premedicated before session    Home Living                          Prior Function            PT Goals (current goals can now be found in the care plan section) Acute Rehab PT Goals Patient Stated Goal: to improve mobility and go home PT Goal Formulation: With  patient Time For Goal Achievement: 08/28/21 Potential to Achieve Goals: Good Progress towards PT goals: Progressing toward goals    Frequency    BID      PT Plan Current plan remains appropriate    Co-evaluation              AM-PAC PT "6 Clicks" Mobility   Outcome Measure  Help needed turning from your back to your side while in a flat bed without using bedrails?: None Help needed moving from lying on your back to sitting on the side of a flat bed without using bedrails?: None Help needed moving to and from a bed to a chair (including a wheelchair)?: A Little Help needed standing up from a chair using your arms (e.g., wheelchair or bedside chair)?: A Little Help needed to walk in hospital room?: A Lot Help needed climbing 3-5 steps with a railing? : Total 6 Click Score: 17    End of Session Equipment Utilized During Treatment: Gait belt Activity Tolerance: Patient tolerated treatment well Patient left: in bed;with bed alarm set;with family/visitor present Nurse Communication: Mobility status PT Visit Diagnosis: Muscle weakness (generalized) (M62.81);Other abnormalities of gait and mobility (R26.89);Difficulty in walking, not elsewhere classified (R26.2)     Time: 6948-5462 PT Time Calculation (min) (ACUTE ONLY): 12 min  Charges:  $Therapeutic Activity: 8-22 mins                     Delphia Grates. Fairly IV, PT, DPT Physical Therapist- [place]  [facility]  08/16/2021, 9:34 AM

## 2021-08-16 NOTE — Progress Notes (Signed)
  Subjective: 2 Days Post-Op Procedure(s) (LRB): Left knee arthroscopy and medial patellofemoral ligament reconstruction using semitendinosus allograft, lateral release, and tibial tubercle osteotomy with distalization and anterior medialization (Left) Patient reports pain as mild.   Patient is well, and has had no acute complaints or problems Plan is to go Home after hospital stay. Negative for chest pain and shortness of breath Fever: no Gastrointestinal: Negative for nausea and vomiting  Objective: Vital signs in last 24 hours: Temp:  [97.9 F (36.6 C)-98.8 F (37.1 C)] 97.9 F (36.6 C) (06/14 0410) Pulse Rate:  [64-96] 71 (06/14 0410) Resp:  [16-20] 20 (06/14 0410) BP: (121-141)/(67-89) 121/76 (06/14 0410) SpO2:  [98 %-100 %] 98 % (06/14 0410)  Intake/Output from previous day: No intake or output data in the 24 hours ending 08/16/21 0717   Intake/Output this shift: No intake/output data recorded.  Labs: No results for input(s): "HGB" in the last 72 hours. No results for input(s): "WBC", "RBC", "HCT", "PLT" in the last 72 hours. No results for input(s): "NA", "K", "CL", "CO2", "BUN", "CREATININE", "GLUCOSE", "CALCIUM" in the last 72 hours. No results for input(s): "LABPT", "INR" in the last 72 hours.   EXAM General - Patient is Alert and Oriented Extremity - Neurovascular intact Sensation intact distally Dorsiflexion/Plantar flexion intact Compartment soft Dressing/Incision - clean, dry, no drainage Motor Function - intact, moving foot and toes well on exam.  Ambulated to the bathroom this morning.  Ambulated 80 feet with physical therapy.  Past Medical History:  Diagnosis Date   Anxiety    Insomnia    Insomnia     Assessment/Plan: 2 Days Post-Op Procedure(s) (LRB): Left knee arthroscopy and medial patellofemoral ligament reconstruction using semitendinosus allograft, lateral release, and tibial tubercle osteotomy with distalization and anterior medialization  (Left) Principal Problem:   Patellar dislocation  Estimated body mass index is 39.65 kg/m as calculated from the following:   Height as of this encounter: 5' (1.524 m).   Weight as of this encounter: 92.1 kg. Advance diet Up with therapy D/C IV fluids  Discharge home with outpatient physical therapy.  DVT Prophylaxis - Aspirin Non-Weight-Bearing to left leg  Reche Dixon, PA-C Orthopaedic Surgery 08/16/2021, 7:17 AM/

## 2021-10-10 ENCOUNTER — Encounter: Payer: Self-pay | Admitting: Psychology

## 2021-10-10 ENCOUNTER — Encounter: Payer: BC Managed Care – PPO | Attending: Psychology | Admitting: Psychology

## 2021-10-10 DIAGNOSIS — F5101 Primary insomnia: Secondary | ICD-10-CM | POA: Insufficient documentation

## 2021-10-10 DIAGNOSIS — F411 Generalized anxiety disorder: Secondary | ICD-10-CM | POA: Diagnosis present

## 2021-10-10 DIAGNOSIS — F8181 Disorder of written expression: Secondary | ICD-10-CM | POA: Insufficient documentation

## 2021-10-10 DIAGNOSIS — F840 Autistic disorder: Secondary | ICD-10-CM | POA: Diagnosis present

## 2021-10-10 NOTE — Progress Notes (Signed)
Neuropsychological Evaluation   Patient:  Jacqueline Burch   DOB: 2000/10/20  MR Number: [id number]  Location: [facility] [place] [facility] [address] [place], [id number] [facility] [place] [address] Dept: [phone]  Start: 1 PM End: 2 PM  Provider/Observer:     Hershal Coria PsyD  Chief Complaint:      Chief Complaint  Patient presents with   Sleeping Problem   Other    Attention and concentration difficulties and sensory overstimulation   Anxiety    Reason For Service:     Jacqueline Burch is a now 21 year old female referred by her treating neurologist Hemang Wallis Mart, MD for neuropsychological evaluation to facilitate with differential diagnostic considerations.  The patient has been diagnosed with primary insomnia with excessive daytime somnolence without signs or symptoms suggestive of cataplexy, hypnagogic or hypnopompic hallucinations.  There are also concerns about the possibility of an underlying attention deficit disorder and autistic spectrum disorder types of conditions.  The patient has had previous sleep studies conducted in 2020 through [facility] with Dr. Vickey Huger due to limited sleep intervals, being fatigued and exhausted during the day but unable to sleep, anxiety type symptoms.  Racing thoughts, worry and test anxiety are issues described are attributed in the past.  During sleep study evaluation there were no indications of organic sleep disorder identified although sleep was short and the patient appeared to have difficulty going into REM sleep without immediately gaining arousal and waking up through previous studies.  During the clinical interview with myself, the patient and her mother both identified issues with ongoing insomnia and concerns that it may be related to adult residual ADHD types of symptoms, which was what the patient was primarily  wanting to be assessed for.  The patient has had insomnia since early middle school and has also dealt with issues around time management, trouble focusing and attending and difficulties multitasking.  The patient reports that she cannot sit still and is easily distracted particularly if there is background noise or other distractors.  The patient reports that she has trouble finding the words that she wants to say.  She also describes significant sensitivity to various textures particularly velvet are overstimulating type textures.  Textures of certain foods really cause her trouble particularly if they are grainy in someway.  Too many sounds going on also cause her a lot of difficulties.  Her mother reports that she needs to have "things to be just right to cope and manage."  The patient reports that she is easily thrown off track with her attention and she always had difficulty in school and often took longer to complete tasks.  She describes poor memory and at night she cannot shut her "mind off".  She reports that she has trouble looking people in the eye and reports that the symptoms of always been there.  She reports that it took her a while to see how this was happening in her life and what issues were being developed.  The patient's mother reports that these issues have been underlying issues throughout her life.  It became very clear when she started school that there were difficulties and the teachers did not always understand what was going on.  By the third or fourth grade, teachers began to better understand how she learned and there was an IEP in place.  Formal testing was conducted in the third or fourth grade noting that her writing  skills were at the kindergarten level but her verbal understanding and verbal expressive skills were in the eighth grade level.  Adjustments were made and the patient had tests and answers provided on a verbal format rather than written and in middle school she was  given a private room and more time to complete task.  Patient's mother reports that over the past 3 to 4 years patient has become more confident in verbalizing her feelings and what causes her difficulty in expressing when things are "too much."  While patient denies significant obsessive/intrusive thoughts, she admits she will get consumed with thinking about hobbies or things that she wants to do.  She reports that she will fidget or do other things like folding origami when listening and she feels like it helps her listen better when she has her mind in her hands doing something different.  The patient reports that she averages about 4 hours of sleep at night and her sleep became very disturbed in late middle school.  2020 sleep study done by Dr. Vickey Huger did not find organic reasons for insomnia but difficulties when entering REM sleep and staying in REM sleep.  Her attending psychiatrist has made specific recommendations around improvements in sleep hygiene but I am not sure a lot of those interventions have been implemented.  Tests Administered: Comprehensive Attention Battery (CAB) Continuous Performance Test (CPT) [place] multiphasic personality inventory-2  Participation Level:   Active  Participation Quality:  Appropriate      Behavioral Observation:  The patient appeared well-groomed and appropriately dressed. Her manners were polite and appropriate to the situation. The patient seemed to be somewhat bothered by the volume of the speakers during the test, frequently adjusting the volume to a lower level.   Well Groomed, Alert, and Appropriate.   Test Results:   The patient appeared to fully participate in the testing procedures.  The patient was administered both the comprehensive attention battery as well as the [place] multiphasic personality inventory.  Measures sensitive to effort levels and full participation in the comprehensive attention battery were all quite good and  appears that the patient tried her hardest throughout these testing procedures.  Also, the validity scales from the MMPI suggest that she approach the measure in an honest and straightforward manner neither attempting to exaggerate or minimize particular symptomatology.  Overall, this does appear to be a fair and valid assessment of her current cognitive and psychological/psychiatric functioning.  The patient was administered the complete comprehensive attention battery as well as the CAB CPT measures.  Initially, the patient was administered the auditory/visual pure reaction time measures.  On the pure visual reaction time measure the patient correctly responded to 49 of 50 targets with 1 error of omission.  Average response time was 280 ms.  Both of these performances with regard to accuracy and response x4 well within normative expectations and factor response times were 0.71 standard deviations better than her normative comparison group.  On the pure auditory reaction time she correctly identified 50 of 50 targets with no errors of omission.  Her average response time was 332 ms which is within normative expectations.  The patient was then administered the discriminate reaction time measures.  On the visual discriminate reaction time test the patient correctly identified 34 of 35 targets with 1 error of commission and 1 error of omission.  These performances are within normative expectations.  The patient's average response time was 383 ms which is on the faster and relative to a  normative population.  Very similar performances were noted for both the auditory discriminate reaction time test as well as the shift discriminate reaction time test.  She had only 1 error of commission and no errors of omission on the auditory discriminate reaction time and had no errors of omission and no errors of commission with complete accuracy on the shift discriminate reaction time.  Average response times were 554 ms and  533 ms respectively.  All of these performances are within normative expectations.  On the auditory/visual scan reaction time test the patient did quite well with excellent accuracy scores and only 1 total error of commission and 1 total error of omission on the 3 individual measures including the visual, auditory and mixed scan reaction time measures.  Average response times were equal to or better than normative expectations performing roughly 0.29  to .5 standard deviations faster than her normative comparison group.  The patient then completed the auditory/visual encoding test.  The patient performed quite well on the auditory backwards, visual encoding forwards and visual encoding backwards with low average performance for the auditory forward portion.  I suspect this was simply a practice effect initially as the patient did well on the auditory backwards subtest.  This pattern suggest that she is doing well with regard to both auditory and visual encoding capacity with visual encoding being somewhat stronger than auditory encoding.  The patient was then administered the Stroop interference cancellation test.  This measure starts out as a basic focus execute task that requires visual scanning, visual searching and overall speed of mental operations without targeted distraction.  Therefore not interference trials that last for 15 seconds each and automatically transition to the second and third and fourth trial as it progresses.  The patient performed in the average range to high end of the average range on all 4 9 interference trials progressively getting better as these trials progressed.  This measure automatically shifts into a targeted interference component (Stroop effect).  While the patient is told this will happen during the instruction.  They did not get a chance to practice it during the trials.  The patient had significant negative response to the auditory distractor.  She had achieved as many  as 15 of 18 targets in the 9 interference trials initially.  On the first interference trial her performance significantly decreased where she only was able to accurately respond and identify 3 of 18 targets.  As this measure progressed she improved approaching her baseline 4 9 interference performance.  This significant difficulty dealing with and adjusting to targeted auditory interference is consistent with her behavioral pattern of trying to reduce the volume level during the various measures of the comprehensive attention battery and describing her difficulties with sensory input throughout life.  Finally, the patient was administered the visual monitor CPT test.  This is a 15-minute visual discriminate reaction time measure assessing sustained attention and concentration.  The patient had done very well on much shorter discriminate reaction time measures previously in this battery.  This particular measure last for 15 minutes and is broken down into five 3-minute blocks of time for assessment.  Consistent with other test performance the patient did quite well throughout this entire 15-minute task.  She correctly responded to between 28 and 30 of 30 targets during each 3-minute block of time.  The patient had no more than 2 errors of omission or 2 errors of commission during any 3-minute block of time.  There was no progressive  changes a function of time noted.  On the first 3 minutes of this measure the patient's average response time was 362 ms which is well within normative expectations.  While her average response time slowly increased across this 15-minute task it was consistent with regard to his increased to normative patterns.  By the final 3 minutes of this measure (12-15-minute mark) the patient's average response time was 465 ms.  This is essentially 100 ms increase in average response time which is extremely typical and consistent with normative patterns and normative expectations.  There did not  appear to be in the deficits with regard to sustaining attention and concentration at least with regard to visual attention.  The patient also completed the [place] multiphasic personality inventory-2 to look at her wide range of various clinical features.  The patient is described difficulties with sensory integration and sensitivities to various tactile sensations on both her skin/touch as well as when she is eating various foods.  The patient also describes significant hypersensitivity to various sounds and distraction from those which also were clearly indicated during the comprehensive attention battery.  The patient admits to becoming hyper focused about hobbies or other things that she wants to do and also tends to engage in various controlled tactile stimulation when in communications with others.  With regard to validity scales, the patient did appear to approach this measure in an honest and straightforward manner neither attempting to exaggerate or minimize various clinically significant symptomatology.  This does appear to be a valid administration.  With regard to clinical scales found in the basic standard content scales the patient showed only 1 clinically significant elevation having to do with scale 8 which is often associated with persons ability to accurately perceive there on thoughts and feelings and mastery over there social and emotional responses.  All other clinical scales were below clinically significant thresholds.  Further analysis utilizing content scales do show elevations on scales related to difficulty coping with various family problems and family stressors.  She denied any significant elevation in anxiety, depression, health concerns, hallucinations or delusional type symptoms or excessive anger.  On supplementary scales the patient did show an elevation on measures related to adjusting to transitions into adulthood.  There were also some mild elevations in both of the PTSD  scales noted.  During the clinical interview there was no indication or descriptions of any life experiences related to potential causative factors related to the development of posttraumatic stress disorder although this should be elucidated during the feedback session to make sure that this is not playing some type of role in her symptoms.  The patient does acknowledge some issues with mental dullness and attention and concentration, reduced drive and feelings of anhedonia as well as slowed psychomotor speech.  All of the symptoms are likely related to prolonged been ongoing sleep difficulties.  The patient has significant elevations related to social alienation and difficulty with social interactions, feeling alienated and difficulty to understand her own emotions and responses and significant elevations with feelings of lack of mastery over her thoughts and cognitions.,   Impression/Diagnosis:   Overall, the results of the current neuropsychological evaluation do in fact suggest the patient has very good fundamental capacity on a wide range of various attention and concentration variables.  The patient shows good focus execute abilities, good auditory and visual encoding capacity, excellent sustained attention and concentration components.  These are true for both auditory and visual attentional variables.  There was one striking  component noted on the testing related to her difficulty adjusting to external targeted auditory distractors which are consistent with subjective reports of difficulties in these areas.  Clinically speaking, the patient describes symptoms consistent with difficulties in understanding and developing an awareness of how others think and feel, difficulties understanding and being aware of how she herself thinks and feels about herself, others in her world in general.  The patient has some obsessional components related to various aspects of her life that she is interested in and  difficulty adjusting to external life stressors.  The patient did have an elevation on 2 PTSD scales that need to be addressed further.  Longstanding sleep difficulties and short sleep duration with suppressed REM activity are all noted clinical features.  As far as diagnostic considerations, the patient does not meet criterion is consistent with attention deficit disorder either with or without hyperactive component.  She does, however, meet criterion for a mild/level 1 severity level autistic spectrum disorder.  The patient presents with consistent findings on the MMPI as well as consistent findings in her clinical symptoms and subjective reports from both the patient and her mother related to persistent deficits in social communication and social interactions and multiple contacts.  She has had difficulty with verbal communication of her own thoughts and feelings and understanding other people's verbal communications and difficulties with coping and maintaining relationships and adjusting to behavior in various social contacts.  The patient also acknowledges stereotyped or repetitive motor movements when listening to others such as folding origami or other activities that she says helps her pay attention when she engages in these stereotyped behaviors.  The patient has an insistence on sameness and adherence to routines and has hyper reactivity to sensory inputs including tactile sensory inputs.  Patient has adverse response to specific sounds and textures of note.  The symptoms appear to have been present even in early developmental.'s and are having a significant impairment in social and educational development.  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  The patient would meet the criterion of autism spectrum disorder level 1 (requiring support).  The patient has needed support throughout school although these  did not become fully implemented until fourth or fifth grade.  She has had deficits in written communication and difficulties in various aspects of social and academic development with atypical or unsuccessful response to social interactions and overtures by others.  This condition would explain the patient's perceptions of difficulties with attention and concentration and would supersede any diagnosis of attention deficit disorder and in fact the patient's primary attentional capacities are quite good with the exception of extreme difficulties with freedom from distraction from outside auditory distractors.  I will sit down with the patient and go over the results of the current neuropsychological evaluation and going to depth with some more personalized directed recommendations.  I will also ask about features that may have elevated her clinical scales on the MMPI related to posttraumatic stress disorder although this may simply be referencing back some of her difficulties interacting and communicate and understanding other people's thoughts and behaviors rather than indicative of actual traumatic experiences in the past.  We also need to further assess her sleep disturbance and anything that could be implemented to improve the duration and sleep quality she has been experiencing.  There have been numerous discussions with the patient regarding sleep hygiene strategies although I am not sure the patient  is really attempted to implement any of those today.  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 Clinical Neuropsychologist

## 2021-10-12 ENCOUNTER — Encounter: Payer: BC Managed Care – PPO | Admitting: Psychology

## 2021-10-23 ENCOUNTER — Encounter (HOSPITAL_BASED_OUTPATIENT_CLINIC_OR_DEPARTMENT_OTHER): Payer: BC Managed Care – PPO | Admitting: Psychology

## 2021-10-23 DIAGNOSIS — F5101 Primary insomnia: Secondary | ICD-10-CM | POA: Diagnosis not present

## 2021-10-23 DIAGNOSIS — F840 Autistic disorder: Secondary | ICD-10-CM | POA: Diagnosis not present

## 2021-10-23 DIAGNOSIS — F411 Generalized anxiety disorder: Secondary | ICD-10-CM

## 2021-10-23 DIAGNOSIS — F8181 Disorder of written expression: Secondary | ICD-10-CM | POA: Diagnosis not present

## 2021-10-26 ENCOUNTER — Other Ambulatory Visit: Payer: Self-pay | Admitting: Orthopedic Surgery

## 2021-10-26 DIAGNOSIS — M25561 Pain in right knee: Secondary | ICD-10-CM

## 2021-10-29 ENCOUNTER — Encounter: Payer: Self-pay | Admitting: Psychology

## 2021-10-29 NOTE — Progress Notes (Signed)
10/23/2021 3 PM-4 PM: Today's visit was an in person visit that was conducted in my outpatient clinic office with the patient, her mother and myself present.  We reviewed the results of the recent neuropsychological evaluation.  I have included the summary and evaluations below from that evaluation and it can be found in its entirety in the patient's EMR dated 10/10/2021.  The patient displayed some difficulty excepting or talk about some of the recommendations developed.  The patient is very frustrated that there are not particular things that she feels like would be helpful.  However, we described a number of things that would require her active participation particular around issues of sleep.  The patient remained very pessimistic and somewhat negative throughout the visit.    Impression/Diagnosis:                     Overall, the results of the current neuropsychological evaluation do in fact suggest the patient has very good fundamental capacity on a wide range of various attention and concentration variables.  The patient shows good focus execute abilities, good auditory and visual encoding capacity, excellent sustained attention and concentration components.  These are true for both auditory and visual attentional variables.  There was one striking component noted on the testing related to her difficulty adjusting to external targeted auditory distractors which are consistent with subjective reports of difficulties in these areas.  Clinically speaking, the patient describes symptoms consistent with difficulties in understanding and developing an awareness of how others think and feel, difficulties understanding and being aware of how she herself thinks and feels about herself, others in her world in general.  The patient has some obsessional components related to various aspects of her life that she is interested in and difficulty adjusting to external life stressors.  The patient did have an elevation on 2  PTSD scales that need to be addressed further.  Longstanding sleep difficulties and short sleep duration with suppressed REM activity are all noted clinical features.  As far as diagnostic considerations, the patient does not meet criterion is consistent with attention deficit disorder either with or without hyperactive component.  She does, however, meet criterion for a mild/level 1 severity level autistic spectrum disorder.  The patient presents with consistent findings on the MMPI as well as consistent findings in her clinical symptoms and subjective reports from both the patient and her mother related to persistent deficits in social communication and social interactions and multiple contacts.  She has had difficulty with verbal communication of her own thoughts and feelings and understanding other people's verbal communications and difficulties with coping and maintaining relationships and adjusting to behavior in various social contacts.  The patient also acknowledges stereotyped or repetitive motor movements when listening to others such as folding origami or other activities that she says helps her pay attention when she engages in these stereotyped behaviors.  The patient has an insistence on sameness and adherence to routines and has hyper reactivity to sensory inputs including tactile sensory inputs.  Patient has adverse response to specific sounds and textures of note.  The symptoms appear to have been present even in early developmental.'s and are having a significant impairment in social and educational development.  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  The patient would meet the criterion of autism spectrum disorder level 1 (requiring support).  The patient has needed support throughout school although these  did not become fully implemented until fourth or fifth grade.  She has had deficits in  written communication and difficulties in various aspects of social and academic development with atypical or unsuccessful response to social interactions and overtures by others.  This condition would explain the patient's perceptions of difficulties with attention and concentration and would supersede any diagnosis of attention deficit disorder and in fact the patient's primary attentional capacities are quite good with the exception of extreme difficulties with freedom from distraction from outside auditory distractors.  I will sit down with the patient and go over the results of the current neuropsychological evaluation and going to depth with some more personalized directed recommendations.  I will also ask about features that may have elevated her clinical scales on the MMPI related to posttraumatic stress disorder although this may simply be referencing back some of her difficulties interacting and communicate and understanding other people's thoughts and behaviors rather than indicative of actual traumatic experiences in the past.  We also need to further assess her sleep disturbance and anything that could be implemented to improve the duration and sleep quality she has been experiencing.  There have been numerous discussions with the patient regarding sleep hygiene strategies although I am not sure the patient is really attempted to implement any of those today.   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 Clinical Neuropsychologist

## 2021-11-02 ENCOUNTER — Ambulatory Visit: Payer: BC Managed Care – PPO | Admitting: Psychology

## 2021-11-09 ENCOUNTER — Ambulatory Visit
Admission: RE | Admit: 2021-11-09 | Discharge: 2021-11-09 | Disposition: A | Discharge status: Home / Self Care | Payer: BC Managed Care – PPO | Source: Ambulatory Visit | Attending: Orthopedic Surgery | Admitting: Orthopedic Surgery

## 2021-11-09 DIAGNOSIS — M25561 Pain in right knee: Secondary | ICD-10-CM

## 2021-11-27 ENCOUNTER — Other Ambulatory Visit: Payer: Self-pay | Admitting: Orthopedic Surgery

## 2021-11-30 ENCOUNTER — Encounter
Admission: RE | Admit: 2021-11-30 | Discharge: 2021-11-30 | Disposition: A | Discharge status: Home / Self Care | Payer: BC Managed Care – PPO | Source: Ambulatory Visit | Attending: Orthopedic Surgery | Admitting: Orthopedic Surgery

## 2021-11-30 VITALS — Ht 60.0 in | Wt 203.0 lb

## 2021-11-30 DIAGNOSIS — S83004A Unspecified dislocation of right patella, initial encounter: Secondary | ICD-10-CM

## 2021-11-30 NOTE — Patient Instructions (Addendum)
Your procedure is scheduled on: Tuesday December 05, 2021. Report to [facility] inside [facility] 2nd floor, stop by registration desk before getting on elevator.  To find out your arrival time please call [phone] between 1PM - 3PM on Monday December 04, 2021.  Remember: Instructions that are not followed completely may result in serious medical risk,  up to and including death, or upon the discretion of your surgeon and anesthesiologist your  surgery may need to be rescheduled.     _X__ 1. Do not eat food after midnight the night before your procedure.                 No chewing gum or hard candies. You may drink clear liquids up to 2 hours                 before you are scheduled to arrive for your surgery- DO not drink clear                 liquids within 2 hours of the start of your surgery.                 Clear Liquids include:  water, apple juice without pulp, clear Gatorade, G2 or                  Gatorade Zero (avoid Red/Purple/Blue), Black Coffee or Tea (Do not add                 anything to coffee or tea).  __X__2.   Complete the "Ensure Clear Pre-surgery Clear Carbohydrate Drink" provided to you, 2 hours before arrival. **If you are diabetic you will be provided with an alternative drink, Gatorade Zero or G2.  __X__3.  On the morning of surgery brush your teeth with toothpaste and water, you                may rinse your mouth with mouthwash if you wish.  Do not swallow any toothpaste or mouthwash.     _X__ 4.  No Alcohol for 24 hours before or after surgery.   _X__ 5.  Do Not Smoke or use e-cigarettes For 24 Hours Prior to Your Surgery.                 Do not use any chewable tobacco products for at least 6 hours prior to                 Surgery.  _X__  6.  Do not use any recreational drugs (marijuana, cocaine, heroin, ecstasy, MDMA or other)                For at least one week prior to your surgery.  Combination of these drugs with anesthesia                 May have life threatening results.  ____  7.  Bring all medications with you on the day of surgery if instructed.   __X__ 8.  Notify your doctor if there is any change in your medical condition      (cold, fever, infections).     Do not wear jewelry, make-up, hairpins, clips or nail polish. Do not wear lotions, powders, or perfumes. You may wear deodorant. Do not shave 48 hours prior to surgery. Men may shave face and neck. Do not bring valuables to the hospital.    [facility] is not responsible for any belongings or valuables.  Contacts, dentures or bridgework may not be worn into surgery. Leave your suitcase in the car. After surgery it may be brought to your room. For patients admitted to the hospital, discharge time is determined by your treatment team.   Patients discharged the day of surgery will not be allowed to drive home.   Make arrangements for someone to be with you for the first 24 hours of your Same Day Discharge.   __X__ Take these medicines the morning of surgery with A SIP OF WATER:    1. None    2.   3.   4.  5.  6.  ____ Fleet Enema (as directed)   __X__ Use CHG Soap (or wipes) as directed  ____ Use Benzoyl Peroxide Gel as instructed  ____ Use inhalers on the day of surgery  ____ Stop metformin 2 days prior to surgery    ____ Take 1/2 of usual insulin dose the night before surgery. No insulin the morning          of surgery.   ____ Call your PCP, cardiologist, or Pulmonologist if taking Coumadin/Plavix/aspirin and ask when to stop before your surgery.   __X__ One Week prior to surgery- Stop Anti-inflammatories such as Ibuprofen, Aleve, Advil, Motrin, meloxicam (MOBIC), diclofenac, etodolac, ketorolac, Toradol, Daypro, piroxicam, Goody's or BC powders. OK TO USE TYLENOL IF NEEDED   __X__ Stop supplements until after surgery.    ____ Bring C-Pap to the hospital.    If you have any questions regarding your pre-procedure  instructions,  Please call Pre-admit Testing at [phone]

## 2021-12-05 ENCOUNTER — Other Ambulatory Visit: Payer: Self-pay

## 2021-12-05 ENCOUNTER — Ambulatory Visit: Payer: BC Managed Care – PPO | Admitting: Certified Registered"

## 2021-12-05 ENCOUNTER — Ambulatory Visit: Payer: BC Managed Care – PPO

## 2021-12-05 ENCOUNTER — Encounter: Payer: Self-pay | Admitting: Orthopedic Surgery

## 2021-12-05 ENCOUNTER — Encounter
Admission: RE | Disposition: A | Discharge status: Home / Self Care | Payer: Self-pay | Source: Home / Self Care | Attending: Orthopedic Surgery

## 2021-12-05 ENCOUNTER — Observation Stay
Admission: RE | Admit: 2021-12-05 | Discharge: 2021-12-06 | Disposition: A | Discharge status: Home / Self Care | Payer: BC Managed Care – PPO | Attending: Orthopedic Surgery | Admitting: Orthopedic Surgery

## 2021-12-05 DIAGNOSIS — M222X1 Patellofemoral disorders, right knee: Secondary | ICD-10-CM | POA: Diagnosis not present

## 2021-12-05 DIAGNOSIS — X58XXXA Exposure to other specified factors, initial encounter: Secondary | ICD-10-CM | POA: Diagnosis not present

## 2021-12-05 DIAGNOSIS — S83014A Lateral dislocation of right patella, initial encounter: Secondary | ICD-10-CM | POA: Diagnosis not present

## 2021-12-05 DIAGNOSIS — M2241 Chondromalacia patellae, right knee: Secondary | ICD-10-CM | POA: Insufficient documentation

## 2021-12-05 DIAGNOSIS — S83006A Unspecified dislocation of unspecified patella, initial encounter: Secondary | ICD-10-CM | POA: Diagnosis present

## 2021-12-05 DIAGNOSIS — S83004A Unspecified dislocation of right patella, initial encounter: Secondary | ICD-10-CM

## 2021-12-05 HISTORY — PX: KNEE ARTHROSCOPY WITH MEDIAL PATELLAR FEMORAL LIGAMENT RECONSTRUCTION: SHX5652

## 2021-12-05 LAB — POCT PREGNANCY, URINE: Preg Test, Ur: NEGATIVE

## 2021-12-05 SURGERY — REPAIR, TENDON, PATELLAR, ARTHROSCOPIC
Anesthesia: General | Site: Knee | Laterality: Right

## 2021-12-05 MED ORDER — BUPIVACAINE LIPOSOME 1.3 % IJ SUSP
INTRAMUSCULAR | Status: AC
  Filled 2021-12-05: qty 20

## 2021-12-05 MED ORDER — LACTATED RINGERS IV SOLN: INTRAVENOUS | Status: DC

## 2021-12-05 MED ORDER — ONDANSETRON HCL 4 MG/2ML IJ SOLN
INTRAMUSCULAR | Status: DC | PRN
  Administered 2021-12-05: 4 mg via INTRAVENOUS

## 2021-12-05 MED ORDER — LIDOCAINE HCL (CARDIAC) PF 100 MG/5ML IV SOSY
PREFILLED_SYRINGE | INTRAVENOUS | Status: DC | PRN
  Administered 2021-12-05: 50 mg via INTRAVENOUS

## 2021-12-05 MED ORDER — SODIUM CHLORIDE 0.9 % IV SOLN: INTRAVENOUS | Status: DC

## 2021-12-05 MED ORDER — OXYCODONE HCL 5 MG PO TABS: 10.0000 mg | ORAL_TABLET | ORAL | Status: DC | PRN

## 2021-12-05 MED ORDER — HYDROMORPHONE HCL 1 MG/ML IJ SOLN
0.2000 mg | INTRAMUSCULAR | Status: DC | PRN
  Filled 2021-12-05: qty 1

## 2021-12-05 MED ORDER — CEFAZOLIN SODIUM-DEXTROSE 2-4 GM/100ML-% IV SOLN
2.0000 g | Freq: Three times a day (TID) | INTRAVENOUS | Status: AC
  Administered 2021-12-05 – 2021-12-06 (×3): 2 g via INTRAVENOUS
  Filled 2021-12-05 (×3): qty 100

## 2021-12-05 MED ORDER — METHOCARBAMOL 1000 MG/10ML IJ SOLN: 500.0000 mg | Freq: Four times a day (QID) | INTRAVENOUS | Status: DC | PRN

## 2021-12-05 MED ORDER — LIDOCAINE HCL (PF) 1 % IJ SOLN
INTRAMUSCULAR | Status: AC
  Filled 2021-12-05: qty 5

## 2021-12-05 MED ORDER — CEFAZOLIN SODIUM-DEXTROSE 2-4 GM/100ML-% IV SOLN
2.0000 g | INTRAVENOUS | Status: AC
  Administered 2021-12-05: 2 g via INTRAVENOUS

## 2021-12-05 MED ORDER — CHLORHEXIDINE GLUCONATE 0.12 % MT SOLN
OROMUCOSAL | Status: AC
  Administered 2021-12-05: 15 mL via OROMUCOSAL
  Filled 2021-12-05: qty 15

## 2021-12-05 MED ORDER — FENTANYL CITRATE (PF) 100 MCG/2ML IJ SOLN
INTRAMUSCULAR | Status: DC | PRN
  Administered 2021-12-05: 50 ug via INTRAVENOUS

## 2021-12-05 MED ORDER — FENTANYL CITRATE (PF) 100 MCG/2ML IJ SOLN
INTRAMUSCULAR | Status: AC
  Administered 2021-12-05: 25 ug via INTRAVENOUS
  Filled 2021-12-05: qty 2

## 2021-12-05 MED ORDER — FENTANYL CITRATE PF 50 MCG/ML IJ SOSY: 50.0000 ug | PREFILLED_SYRINGE | Freq: Once | INTRAMUSCULAR | Status: AC

## 2021-12-05 MED ORDER — OXYCODONE HCL 5 MG PO TABS
5.0000 mg | ORAL_TABLET | ORAL | Status: DC | PRN
  Administered 2021-12-05: 5 mg via ORAL
  Filled 2021-12-05: qty 1

## 2021-12-05 MED ORDER — SUGAMMADEX SODIUM 200 MG/2ML IV SOLN
INTRAVENOUS | Status: DC | PRN
  Administered 2021-12-05: 200 mg via INTRAVENOUS

## 2021-12-05 MED ORDER — BUPIVACAINE HCL 0.5 % IJ SOLN
INTRAMUSCULAR | Status: DC | PRN
  Administered 2021-12-05: 5 mL

## 2021-12-05 MED ORDER — MIDAZOLAM HCL 2 MG/2ML IJ SOLN
INTRAMUSCULAR | Status: AC
  Filled 2021-12-05: qty 2

## 2021-12-05 MED ORDER — SENNOSIDES-DOCUSATE SODIUM 8.6-50 MG PO TABS: 1.0000 | ORAL_TABLET | Freq: Every evening | ORAL | Status: DC | PRN

## 2021-12-05 MED ORDER — DIPHENHYDRAMINE HCL 12.5 MG/5ML PO ELIX: 12.5000 mg | ORAL_SOLUTION | ORAL | Status: DC | PRN

## 2021-12-05 MED ORDER — BUPIVACAINE HCL (PF) 0.5 % IJ SOLN
INTRAMUSCULAR | Status: AC
  Filled 2021-12-05: qty 30

## 2021-12-05 MED ORDER — LACTATED RINGERS IV SOLN: INTRAVENOUS | Status: DC | PRN

## 2021-12-05 MED ORDER — SODIUM CHLORIDE 0.9 % IV SOLN
Status: DC | PRN
  Administered 2021-12-05: 200 mL

## 2021-12-05 MED ORDER — ROPIVACAINE HCL 5 MG/ML IJ SOLN
INTRAMUSCULAR | Status: DC | PRN
  Administered 2021-12-05: 30 mL via PERINEURAL

## 2021-12-05 MED ORDER — FENTANYL CITRATE (PF) 100 MCG/2ML IJ SOLN
INTRAMUSCULAR | Status: AC
  Filled 2021-12-05: qty 2

## 2021-12-05 MED ORDER — DEXAMETHASONE SODIUM PHOSPHATE 10 MG/ML IJ SOLN
INTRAMUSCULAR | Status: DC | PRN
  Administered 2021-12-05: 10 mg via INTRAVENOUS

## 2021-12-05 MED ORDER — MIDAZOLAM HCL 2 MG/2ML IJ SOLN
INTRAMUSCULAR | Status: AC
  Administered 2021-12-05: 1 mg via INTRAVENOUS
  Filled 2021-12-05: qty 2

## 2021-12-05 MED ORDER — ROPIVACAINE HCL 5 MG/ML IJ SOLN
INTRAMUSCULAR | Status: AC
  Filled 2021-12-05: qty 30

## 2021-12-05 MED ORDER — ACETAMINOPHEN 500 MG PO TABS
1000.0000 mg | ORAL_TABLET | Freq: Three times a day (TID) | ORAL | Status: DC
  Administered 2021-12-05 – 2021-12-06 (×2): 1000 mg via ORAL
  Filled 2021-12-05 (×2): qty 2

## 2021-12-05 MED ORDER — NEOMYCIN-POLYMYXIN B GU 40-200000 IR SOLN
Status: AC
  Filled 2021-12-05: qty 20

## 2021-12-05 MED ORDER — ONDANSETRON HCL 4 MG/2ML IJ SOLN: 4.0000 mg | Freq: Four times a day (QID) | INTRAMUSCULAR | Status: DC | PRN

## 2021-12-05 MED ORDER — FAMOTIDINE 20 MG PO TABS
ORAL_TABLET | ORAL | Status: AC
  Administered 2021-12-05: 20 mg via ORAL
  Filled 2021-12-05: qty 1

## 2021-12-05 MED ORDER — ORAL CARE MOUTH RINSE: 15.0000 mL | Freq: Once | OROMUCOSAL | Status: AC

## 2021-12-05 MED ORDER — FENTANYL CITRATE (PF) 100 MCG/2ML IJ SOLN
25.0000 ug | INTRAMUSCULAR | Status: DC | PRN
  Administered 2021-12-05 (×3): 25 ug via INTRAVENOUS

## 2021-12-05 MED ORDER — LIDOCAINE HCL (PF) 1 % IJ SOLN
INTRAMUSCULAR | Status: DC | PRN
  Administered 2021-12-05: 5 mL via SUBCUTANEOUS

## 2021-12-05 MED ORDER — ROCURONIUM BROMIDE 100 MG/10ML IV SOLN
INTRAVENOUS | Status: DC | PRN
  Administered 2021-12-05: 20 mg via INTRAVENOUS
  Administered 2021-12-05: 50 mg via INTRAVENOUS

## 2021-12-05 MED ORDER — DOCUSATE SODIUM 100 MG PO CAPS
100.0000 mg | ORAL_CAPSULE | Freq: Two times a day (BID) | ORAL | Status: DC
  Administered 2021-12-05 – 2021-12-06 (×2): 100 mg via ORAL
  Filled 2021-12-05 (×2): qty 1

## 2021-12-05 MED ORDER — FAMOTIDINE 20 MG PO TABS: 20.0000 mg | ORAL_TABLET | Freq: Once | ORAL | Status: AC

## 2021-12-05 MED ORDER — ASPIRIN 325 MG PO TBEC
325.0000 mg | DELAYED_RELEASE_TABLET | Freq: Every day | ORAL | Status: DC
  Administered 2021-12-06: 325 mg via ORAL
  Filled 2021-12-05: qty 1

## 2021-12-05 MED ORDER — BUPIVACAINE LIPOSOME 1.3 % IJ SUSP
INTRAMUSCULAR | Status: DC | PRN
  Administered 2021-12-05: 10 mL via SURGICAL_CAVITY

## 2021-12-05 MED ORDER — CEFAZOLIN SODIUM-DEXTROSE 2-4 GM/100ML-% IV SOLN
INTRAVENOUS | Status: AC
  Filled 2021-12-05: qty 100

## 2021-12-05 MED ORDER — MIDAZOLAM HCL 2 MG/2ML IJ SOLN
INTRAMUSCULAR | Status: DC | PRN
  Administered 2021-12-05: 2 mg via INTRAVENOUS

## 2021-12-05 MED ORDER — LACTATED RINGERS IR SOLN
Status: DC | PRN
  Administered 2021-12-05: 3000 mL

## 2021-12-05 MED ORDER — CHLORHEXIDINE GLUCONATE 0.12 % MT SOLN: 15.0000 mL | Freq: Once | OROMUCOSAL | Status: AC

## 2021-12-05 MED ORDER — LIDOCAINE-EPINEPHRINE 1 %-1:100000 IJ SOLN
INTRAMUSCULAR | Status: AC
  Filled 2021-12-05: qty 1

## 2021-12-05 MED ORDER — MIDAZOLAM HCL 2 MG/2ML IJ SOLN: 1.0000 mg | INTRAMUSCULAR | Status: DC | PRN

## 2021-12-05 MED ORDER — ONDANSETRON HCL 4 MG PO TABS: 4.0000 mg | ORAL_TABLET | Freq: Four times a day (QID) | ORAL | Status: DC | PRN

## 2021-12-05 MED ORDER — DEXMEDETOMIDINE HCL IN NACL 200 MCG/50ML IV SOLN
INTRAVENOUS | Status: DC | PRN
  Administered 2021-12-05 (×2): 8 ug via INTRAVENOUS

## 2021-12-05 MED ORDER — FENTANYL CITRATE PF 50 MCG/ML IJ SOSY
PREFILLED_SYRINGE | INTRAMUSCULAR | Status: AC
  Administered 2021-12-05: 50 ug via INTRAVENOUS
  Filled 2021-12-05: qty 1

## 2021-12-05 MED ORDER — METHOCARBAMOL 500 MG PO TABS: 500.0000 mg | ORAL_TABLET | Freq: Four times a day (QID) | ORAL | Status: DC | PRN

## 2021-12-05 MED ORDER — PROPOFOL 10 MG/ML IV BOLUS
INTRAVENOUS | Status: DC | PRN
  Administered 2021-12-05: 150 mg via INTRAVENOUS

## 2021-12-05 SURGICAL SUPPLY — 94 items
ADAPTER IRRIG TUBE 2 SPIKE SOL (ADAPTER) ×1 IMPLANT
ANCHOR ALL-SUT Q-FIX 1.8 BLUE (Anchor) IMPLANT
ANCHOR ALL-SUT Q-FIX 2.8 (Anchor) IMPLANT
BASIN GRAD PLASTIC 32OZ STRL (MISCELLANEOUS) ×1 IMPLANT
BLADE FULL RADIUS 3.5 (BLADE) ×1 IMPLANT
BLADE SHAVER 4.5X7 STR FR (MISCELLANEOUS) IMPLANT
BLADE SURG 15 STRL LF DISP TIS (BLADE) IMPLANT
BLADE SURG 15 STRL SS (BLADE) ×1
BLADE SURG SZ10 CARB STEEL (BLADE) ×1 IMPLANT
BLADE SURG SZ11 CARB STEEL (BLADE) ×1 IMPLANT
BNDG ESMARK 6X12 TAN STRL LF (GAUZE/BANDAGES/DRESSINGS) ×1 IMPLANT
BRACE KNEE POST OP SHORT (BRACE) ×1 IMPLANT
BUR 4X45 EGG (BURR) ×1 IMPLANT
BUR 4X55 1 (BURR) ×1 IMPLANT
CHLORAPREP W/TINT 26 (MISCELLANEOUS) ×1 IMPLANT
COOLER POLAR GLACIER W/PUMP (MISCELLANEOUS) ×1 IMPLANT
COVER MAYO STAND STRL (DRAPES) ×1 IMPLANT
CUFF TOURN SGL QUICK 24 (TOURNIQUET CUFF) ×1
CUFF TOURN SGL QUICK 34 (TOURNIQUET CUFF) ×1
CUFF TRNQT CYL 24X4X16.5-23 (TOURNIQUET CUFF) ×1 IMPLANT
CUFF TRNQT CYL 34X4.125X (TOURNIQUET CUFF) ×1 IMPLANT
DERMABOND ADVANCED .7 DNX12 (GAUZE/BANDAGES/DRESSINGS) IMPLANT
DRAPE ARTHRO LIMB 89X125 STRL (DRAPES) ×1 IMPLANT
DRAPE C-ARM XRAY 36X54 (DRAPES) ×1 IMPLANT
DRAPE C-ARMOR (DRAPES) ×1 IMPLANT
DRAPE IMP U-DRAPE 54X76 (DRAPES) ×1 IMPLANT
DRAPE SHEET LG 3/4 BI-LAMINATE (DRAPES) ×1 IMPLANT
DRAPE SURG 17X11 SM STRL (DRAPES) ×1 IMPLANT
DRAPE TABLE BACK 80X90 (DRAPES) ×1 IMPLANT
DRAPE TOWEL STERILE LF 18X12 (DRAPES) IMPLANT
DRSG OPSITE POSTOP 3X4 (GAUZE/BANDAGES/DRESSINGS) ×1 IMPLANT
ELECT REM PT RETURN 9FT ADLT (ELECTROSURGICAL) ×1
ELECTRODE REM PT RTRN 9FT ADLT (ELECTROSURGICAL) ×1 IMPLANT
GAUZE SPONGE 4X4 12PLY STRL (GAUZE/BANDAGES/DRESSINGS) ×1 IMPLANT
GLOVE BIOGEL PI IND STRL 8 (GLOVE) ×1 IMPLANT
GLOVE SURG ORTHO 8.0 STRL STRW (GLOVE) ×1 IMPLANT
GOWN STRL REUS W/ TWL LRG LVL3 (GOWN DISPOSABLE) ×1 IMPLANT
GOWN STRL REUS W/ TWL XL LVL3 (GOWN DISPOSABLE) ×1 IMPLANT
GOWN STRL REUS W/TWL LRG LVL3 (GOWN DISPOSABLE) ×1
GOWN STRL REUS W/TWL XL LVL3 (GOWN DISPOSABLE) ×1
GRADUATE 1200CC STRL 31836 (MISCELLANEOUS) ×1 IMPLANT
GRAFT TISS SEMITEND 4-8 (Bone Implant) IMPLANT
HANDLE YANKAUER SUCT BULB TIP (MISCELLANEOUS) ×1 IMPLANT
HEMOVAC 400CC 10FR (MISCELLANEOUS) IMPLANT
IV LACTATED RINGER IRRG 3000ML (IV SOLUTION) ×4
IV LR IRRIG 3000ML ARTHROMATIC (IV SOLUTION) ×8 IMPLANT
KIT SUTURE 1.8 Q-FIX DISP (KITS) IMPLANT
KIT SUTURE 2.8 Q-FIX DISP (MISCELLANEOUS) IMPLANT
KIT TURNOVER KIT A (KITS) ×1 IMPLANT
LABEL OR SOLS (LABEL) ×1 IMPLANT
MANIFOLD NEPTUNE II (INSTRUMENTS) ×2 IMPLANT
MAT ABSORB  FLUID 56X50 GRAY (MISCELLANEOUS) ×1
MAT ABSORB FLUID 56X50 GRAY (MISCELLANEOUS) ×1 IMPLANT
NDL FILTER BLUNT 18X1 1/2 (NEEDLE) ×1 IMPLANT
NDL HYPO 21X1.5 SAFETY (NEEDLE) ×1 IMPLANT
NDL MAYO CATGUT SZ5 (NEEDLE) ×1
NDL SAFETY ECLIP 18X1.5 (MISCELLANEOUS) ×1 IMPLANT
NDL SUT 5 .5 CRC TROC PNT MAYO (NEEDLE) IMPLANT
NEEDLE FILTER BLUNT 18X1 1/2 (NEEDLE) ×1 IMPLANT
NEEDLE HYPO 21X1.5 SAFETY (NEEDLE) ×1 IMPLANT
NEEDLE HYPO 22GX1.5 SAFETY (NEEDLE) ×1 IMPLANT
NS IRRIG 1000ML POUR BTL (IV SOLUTION) ×1 IMPLANT
PACK ARTHROSCOPY KNEE (MISCELLANEOUS) ×1 IMPLANT
PAD ABD DERMACEA PRESS 5X9 (GAUZE/BANDAGES/DRESSINGS) ×1 IMPLANT
PAD CAST 4YDX4 CTTN HI CHSV (CAST SUPPLIES) ×1 IMPLANT
PAD WRAPON POLAR KNEE (MISCELLANEOUS) ×1 IMPLANT
PADDING CAST COTTON 4X4 STRL (CAST SUPPLIES) ×1
PADDING CAST COTTON 6X4 NS (MISCELLANEOUS) ×1 IMPLANT
RETRIEVER SUT HEWSON (MISCELLANEOUS) ×1 IMPLANT
SCREW SHAFT 4.5 40MM (Screw) IMPLANT
SCREW SHAFT 4.5 44MM (Screw) IMPLANT
SCREW SHAFT 4.5 48MM (Screw) IMPLANT
SLEEVE REMOTE CONTROL 5X12 (DRAPES) IMPLANT
SPONGE T-LAP 18X18 ~~LOC~~+RFID (SPONGE) ×2 IMPLANT
STAPLER SKIN PROX 35W (STAPLE) ×1 IMPLANT
SUCTION FRAZIER HANDLE 10FR (MISCELLANEOUS) ×1
SUCTION TUBE FRAZIER 10FR DISP (MISCELLANEOUS) ×1 IMPLANT
SUT ETHILON 3-0 FS-10 30 BLK (SUTURE) ×1
SUT MNCRL AB 4-0 PS2 18 (SUTURE) IMPLANT
SUT VIC AB 0 CT1 36 (SUTURE) ×1 IMPLANT
SUT VIC AB 2-0 CT2 27 (SUTURE) ×1 IMPLANT
SUTURE EHLN 3-0 FS-10 30 BLK (SUTURE) ×1 IMPLANT
SYR 10ML LL (SYRINGE) ×1 IMPLANT
SYR 30ML LL (SYRINGE) ×1 IMPLANT
SYR 50ML LL SCALE MARK (SYRINGE) ×1 IMPLANT
SYR BULB IRRIG 60ML STRL (SYRINGE) ×1 IMPLANT
TENDON SEMI-TENDINOSUS (Bone Implant) ×1 IMPLANT
TOWEL OR 17X26 4PK STRL BLUE (TOWEL DISPOSABLE) ×1 IMPLANT
TRAP FLUID SMOKE EVACUATOR (MISCELLANEOUS) ×1 IMPLANT
TUBING INFLOW SET DBFLO PUMP (TUBING) ×1 IMPLANT
TUBING OUTFLOW SET DBLFO PUMP (TUBING) ×1 IMPLANT
WAND WEREWOLF FLOW 90D (MISCELLANEOUS) ×1 IMPLANT
WATER STERILE IRR 500ML POUR (IV SOLUTION) ×1 IMPLANT
WRAPON POLAR PAD KNEE (MISCELLANEOUS) ×1

## 2021-12-05 NOTE — Op Note (Addendum)
OPERATIVE NOTE  SURGERY DATE: 12/05/2021    PRE-OP DIAGNOSIS:  1. Right patella dislocation 2. Right knee patellofemoral misalignment 3. 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 Right patella dislocation 2. Right knee patellofemoral misalignment 3. 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 Right knee patella focal chondral defect   PROCEDURES:  1. Right knee tibial tubercle osteotomy (distalization and anteromedialization) 2. Right knee medial patellofemoral ligament reconstruction using allograft 3. Right knee arthroscopic lateral retinacular release 4. Right knee patella chondroplasty   SURGEON:Lyfe Reihl Jearld Lesch, MD   ASSISTANT(S): Reche Dixon, [place]; Evette Georges, PA-S    ANESTHESIA: Gen + regional anesthesia   TOTAL IV FLUIDS: see anesthesia record   ESTIMATED BLOOD LOSS: 50cc   TOURNIQUET TIME: 130 min   DRAINS: medium 10 Fr Hemovac   SPECIMENS: None.   IMPLANTS:  - 4.35m cortical screws x 3 - Qfix double loaded suture anchor x 1 - Qfix Mini suture anchors x 2 - Semitendinosus allograft   COMPLICATIONS: None apparent.   INDICATIONS: Jacqueline Hefleyis a 21y.o. female with knee pain and patellar instability.  The patient had patellar malalignment with TT-TG distance of 259mand patella alta with a Caton-Dechamps ratio of 1.5.  She also has a Dejour type D trochlear dysplasia.  She has had multiple patellar dislocations with minimal trauma with last one occurring while she was simply standing.  Given these findings, and failure of nonoperative management, surgery was recommended for tibial tubercle osteotomy to improve patella alignment, correct patella alta, and unload the patellofemoral joint as well as MPFL reconstruction to reduce chance of recurrent patellar dislocation. The patient also had lateral tilt of the patella so lateral release was indicated.  After  discussion of risks, benefits, and alternatives to surgery, the patient and her mother elected to proceed.  Of note, the patient went for surgery on the contralateral side on 08/14/2021, and she is doing very well and is pleased with the result of that surgery.   OPERATIVE FINDINGS:    Examination under anesthesia: A careful examination under anesthesia was performed.  Passive range of motion was: Hyperextension: 2.  Extension: 0.  Flexion: 125.  Lachman: normal. Pivot Shift: normal.  Posterior drawer: normal.  Varus stability in full extension: normal.  Varus stability in 30 degrees of flexion: normal.  Valgus stability in full extension: normal.  Valgus stability in 30 degrees of flexion: normal. Patella: 4 quadrants lateral mobility with ability to dislocate patella, 2 quadrants medial mobility, lateral patellar tracking, and lateral tilt   Intra-operative findings: A thorough arthroscopic examination of the knee was performed.  The findings are: 1. Suprapatellar pouch:  normal 2. Undersurface of median ridge: Grade 1 softening diffusely 3. Medial patellar facet: Grade 1 softening 4. Lateral patellar facet: Focal area measuring approximately 5 x 5 mm of grade 3-4 degenerative change significant lateral overhang 5. Trochlea: Dysplastic with supratrochlear spur present 6. Lateral gutter/popliteus tendon: Normal 7. Hoffa's fat pad: Normal 8. Medial gutter/plica: Normal 9. ACL: Normal 10. PCL: Normal 11. Medial meniscus: Normal 12. Medial compartment cartilage: Normal 13. Lateral meniscus: Normal 14. Lateral compartment cartilage: Normal   DETAILS OF PROCEDURE: The patient was identified in the preoperative holding area and the appropriate operative extremity was marked.  Informed consent was again verified with the patient and parents.  The patient was then brought to the operating room and placed supine on the OR  table.  A tourniquet was placed on the thigh.  All bony prominences were  well-padded and the arms were in a neutral position.  The operative extremity was prescrubbed with Hibiclens and alcohol, prepped with ChloraPrep, and draped in the usual sterile fashion. The patient was given preoperative IV antibiotics and a surgical time-out confirming patient identity, procedure, and laterality was performed.    We first completed the knee arthroscopy portion of the procedure.  A standard anterolateral portal was made with an 11 blade. A standard anteromedial portal was made using needle localization in a similar fashion. A diagnostic arthroscopy was performed with the above findings. Given significant lateral patellar tilt, a lateral release was performed using an Arthrocare wand. This extended from just distal to the vastus lateralis tendon insertion to the anterolateral portal. This significantly improved the patellar tilt.  A chondroplasty of the small focal chondral defect of the patella was performed using an oscillating shaver until there were stable cartilaginous edges.  Given the small size of the lesion, further treatment was not considered at this time.   An Esmarch bandage was used to exsanguinate the leg and tourniquet was inflated. Next, the tibial tubercle osteotomy was performed.  A midline incision from the proximal tibial tubercle was extended to ~8cm distal to the tibial tubercle.  Medial and lateral flaps were developed. The anterior compartment musculature was released subperiosteally and protected.  Two guidepins were placed at an ~45 degree angle to estimate the trajectory of the osteotomy.  Once this was satisfactory, an oscillating saw was used to make the cut.  Osteotomes were used proximally to finish the lateral and medial cuts on the shingle.  The proximal aspect of the tibial tubercle was then elevated.  The distal shingle was then cut and approximately 42m of the shingle was removed to allow for 16mdistalization.  The tibial tubercle was then also shifted  medially ~1070m Two guidepins were used to hold the osteotomy in place.  Fluoroscopy was used to verify appropriate distalization and correction of patella alta.  Three fully-threaded cortical screws, 4.5 mm in diameter were then placed in a lag fashion. The countersink was used to limit the prominence of the screw heads.  3 screws were utilized instead of the standard 2 screws given that the distal hinge was no longer intact due to the distalization.  Position of osteotomy and hardware was confirmed fluoroscopically. Bone wax was used to cover the non-healing bony surfaces to limit cancellous bleeding. There was no unusual bleeding.  A medium Hemovac drain was placed along the tibial tubercle osteotomy site.    Next, the MPFL reconstruction was performed.  The knee was placed in 30 degrees knee flexion.  An incision was made over the superomedial aspect of the patella. Careful dissection was performed to identify layers 1 and 2. A blunt kelly clamp was then passed between layers 2 and 3 posteriorly towards the femoral origin of the MPFL, confirming that this was the appropriate layer. Using bovie electrocautery, dissection onto the medial surface of the patella was performed, stopping short of exposing the articular margin. A rongeur was used to create a trough at the midportion of the patella. Using fluoroscopy, two Qfix Mini all suture anchors were placed into the trough created on the patella. The first was placed at the midportion of the patella in the superior/inferior aspect, and the second was placed ~1.5cm superior near the angle of the patella. Next, Schottle's point was localized with fluoroscopy and an  incision was made over it. Dissection was carried down with bovie electrocautery and fascia was incised longitudinally.  A drill guide for a Q fix double loaded anchor was placed over Schottle's point.  This was confirmed fluoroscopically.  Then, aiming approximately 20 degrees proximal and anterior to  avoid the the intercondylar notch, the Q fix anchor was placed in this fashion.  The semitendinosus allograft was marked at the midportion.  One limb of each of the 2 sutures was placed above the graft and the other limb was placed below the graft.  A circumferential, loop stitch was tied on either side of the central mark on the allograft.  This appropriately brought the graft to the anchor at Schottle's point.  A Kelly clamp was passed in between layers 2 and 3 to retreive both free ends of the graft. The graft limbs were held at a resting tension. One limb of the superior patella anchor was passed in a [place]ow fashion through one free end of the graft and the second limb was passed through the graft in a simple fashion. This limb became the post and was used to shuttle the graft down to the anchor. This was then tied with alternating half-hitches. Similarly, this was performed for the more inferior suture anchor and the other free end of the graft. This achieved appropriate constraint of the patella. Patella could not be dislocated laterally afterwards.   All wounds were thoroughly irrigated. 3-0 Nylon sutures were used to close the arthroscopic portal incisions and the medial distal femoral incision. 0-Vicryl sutures were used to close the split between layers 2 and 3. 2-0 Vicryl and 4-0 Monocryl with Dermabond were used to close the subdermal layer and skin of all remaining incisions. Leg was wrapped in cotton and bias wrap.  Polar Care and hinged knee brace locked at 0 degrees were applied.  The patient was brought to PACU in stable condition.   Instrument, sponge, and needle counts were correct prior to wound closure and at the conclusion of the case.    Of note, assistance from a PA was essential to performing the surgery.  PA was present for the entire surgery.  PA assisted with patient positioning, retraction, instrumentation, and wound closure. The surgery would have been more difficult and had  longer operative time without PA assistance.     Of note, the tibial tubercle osteotomy and MPFL reconstruction had significantly added complexity.  Given the patient's weight, primarily located about the knee and leg region, there was extensive soft tissue dissection necessary.  Incision had to be made larger than usual to allow for appropriate visualization and completion of the procedure.  Additionally distalization portion of the tibial tubercle osteotomy necessitated use of appropriate fluoroscopy to ensure correct positioning as well as instrumentation to place a third fixation screw.  All of these factors added approximately 30 minutes to the total surgical time for this procedure.          DISPOSITION: PACU - hemodynamically stable.   POSTOPERATIVE PLAN: The patient will be discharged home tomorrow after overnight stay pending PT evaluation.  Antibiotics will be given overnight, but discontinued within 24 hours of surgery.  Aspirin 325 mg/day x 4 weeks for DVT prophylaxis.   The patient will be non-weight bearing for 4 weeks, and then may 50% weight bear from weeks 4 to 6, and may fully weight bear at week 6.  Brace will be locked for ambulation for the first 6 weeks using 2 crutches.  Physical therapy to begin post-op day 3-4 for knee range of motion, patellar mobilization and scar mobilization.  CPM to start on POD#1.    Return to clinic in ~2 weeks.                          REHAB PROTOCOL: Dr. Cato Mulligan, M.D.     PATELLAR REALIGNMENT PROTOCOL -  MPFL RECONSTRUCTION AND TIBIAL TUBERCLE OSTEOTOMY ________________________________________________________________________   GENERAL GUIDELINES:  No closed kinetic chain exercise for 4 weeks The same rehabilitation protocol is followed for proximal and distal realignments with the exception of range of motion limitations as noted   GENERAL PROGRESSION OF ACTIVITIES OF DAILY LIVING:  Patients may begin the following  activities at the dates indicated (unless otherwise specified by the physician): Bathing/showering after 1 week Sleep with brace locked in extension for 4 weeks Driving at 4 weeks post-op Brace locked in extension for 6 weeks for ambulation Use of crutches continued for 6-8 weeks post-op No weightbearing for 4 weeks, then 50% weight-bearing for 2 more weeks Full weight-bearing at 6 weeks   REHABILITATION PROGRESSION:  The following is a general guideline for progression of the rehabilitation program following patellar realignment.  Progression through each phase should take into consideration patient status (e.g. healing, function) and physician advisement.  Please consult the attending physician if there is uncertainty regarding the advancement of a patient to the next phase of rehabilitation.   PHASE I: Begins immediately post-op through approximately 6 weeks.   Goals: Protect fixation and surrounding soft tissue Control inflammatory process Regain active quadriceps and VMO control Minimize the adverse effects of immobilization through CPM and heel slides in the allowed range of motion Full knee extension Patient education regarding rehabilitation process   ROM:  0 - 4 weeks:    0 - 90 flexion    Brace:  0 - 4 weeks:    Locked in full extension for all activities except therapeutic  exercises and CPM use                                                  Locked in full extension for sleeping 4 - 6 weeks:    Unlock brace for sleeping, continue with brace locked in full extension for ambulation     Weightbearing Status:  No weight-bearing for 4 weeks, then 50% weight-bearing for 2 more weeks Full weight-bearing at 6 weeks   Therapeutic Exercises: Extreme attention to patellar mobs in 4 directions, especially inferior and superior. Massage infrapatellar fat pad. Perform, but take caution with lateral mobs as to not stress MPFL reconstruction Quad sets and isometric  adduction with biofeedback for VMO Heel slides from 0-90 of flexion  CPM for 8 hours daily. Week 1: 0-45 Week 2: 0-60 Week 3: 0-75 Week 4: 0-90 Non-weight bearing gastrocnemius/soleus hamstring stretches SLR in four planes with brace locked in full extension (can be performed in standing) Resisted ankle ROM with theraband Patellar mobilization (begin when tolerated by patient)   PHASE II:  Begins approximately 6 weeks post-op and extends to approximately 8 weeks post-op.  Criteria for advancement to Phase II: Good quad set Approximately 90 of flexion No signs of active inflammation   Goals: Increase range of flexion Avoid overstressing fixation Increase quadriceps and VMO control for restoration of proper  patellar tracking.   Brace: 6 - 8 weeks: Discontinue use for sleeping, unlock for ambulation as allowed by physician.   Weightbearing Status:  6-8 weeks: As tolerated with two crutches if cleared.   Therapeutic Exercises: Continue exercises as noted above, progress towards full flexion with heel slides Progress to weight-bearing gastrocnemius/soleus stretching Discontinue CPM if knee flexion is at least 90 Begin aquatic therapy, emphasis on normalization of gait Balance exercises (e.g. single-leg standing, KAT) Remove brace for SLR exercises Stationary bike, low resistance, high seat Short arc quadriceps exercises in pain free ranges (0-20, 60-90 of flexion) emphasize movement quality Wall slides progressing to mini-squats, 0-45 of flexion   PHASE III:  Begins approximately 8 weeks post-op and extends through approximately 4 months.  Criteria for advancement to Phase III: Good quadriceps tone and no extension lag with SLR Non-antalgic gait pattern Good dynamic patellar control with no evidence of lateral tracking or instability   Weightbearing Status: May discontinue use of crutches when the following criteria are met: No extension lag with SLR Full  extension Non-antalgic gait pattern (may use one crutch or cane until gait is normalized)   Therapeutic Exercises: Stationary bike, add moderate resistance 4 way hip flexion, adduction, abduction, extension Leg press 0-45 of flexion Closed kinetic chain terminal knee extension with resistive tubing or weight machine Swimming, Stairmaster for endurance Toe raises Hamstring curls Treadmill walking with emphasis on normalization of gait  Continue proprioception exercises Continue flexibility exercises for gastrocnemius/soleus and hamstrings, add iliotibial band and quadriceps as indicated At 3 months: Step-ups, begin at 2" and progress towards 8"   PHASE IV:  Begins approximately 4 months post-op and extends through approximately 6 months.  Criteria for advancement to Phase IV: Good to normal quadriceps strength No evidence of patellar instability No soft tissue complaints Normal gait pattern Clearance from physician to begin more concentrated closed kinetic chain exercises, and resume full or partial activity   Goals: Continue improvements in quadriceps strength Improve functional strength and proprioception Return to appropriate activity level   Therapeutic Exercises: Progression of closed kinetic chain activities Jogging in pool with wet vest or belt Functional progression, sport-specific activities or work hardening as appropriate       For any questions or concerns regarding the protocol or rehabilitation process please contact my office: [facility] Phone: [phone] Fax: [phone]

## 2021-12-05 NOTE — Anesthesia Preprocedure Evaluation (Signed)
Anesthesia Evaluation  Patient identified by MRN, date of birth, ID band Patient awake    Reviewed: Allergy & Precautions, NPO status , Patient's Chart, lab work & pertinent test results  History of Anesthesia Complications Negative for: history of anesthetic complications  Airway Mallampati: II  TM Distance: >3 FB Neck ROM: full    Dental no notable dental hx. (+) Dental Advidsory Given   Pulmonary neg pulmonary ROS,    Pulmonary exam normal        Cardiovascular negative cardio ROS Normal cardiovascular exam     Neuro/Psych PSYCHIATRIC DISORDERS Anxiety negative neurological ROS     GI/Hepatic negative GI ROS, Neg liver ROS,   Endo/Other  negative endocrine ROS  Renal/GU      Musculoskeletal Dislocation of left knee   Abdominal (+) + obese,   Peds  Hematology negative hematology ROS (+)   Anesthesia Other Findings Past Medical History: No date: Anxiety No date: Insomnia No date: Insomnia  No past surgical history on file.     Reproductive/Obstetrics negative OB ROS                             Anesthesia Physical  Anesthesia Plan  ASA: 2  Anesthesia Plan: General   Post-op Pain Management: Tylenol PO (pre-op)*, Regional block*, Gabapentin PO (pre-op)* and Celebrex PO (pre-op)*   Induction: Intravenous  PONV Risk Score and Plan: 3 and Ondansetron, Dexamethasone and Midazolam  Airway Management Planned: Oral ETT  Additional Equipment:   Intra-op Plan:   Post-operative Plan: Extubation in OR  Informed Consent: I have reviewed the patients History and Physical, chart, labs and discussed the procedure including the risks, benefits and alternatives for the proposed anesthesia with the patient or authorized representative who has indicated his/her understanding and acceptance.     Dental Advisory Given  Plan Discussed with: Anesthesiologist, CRNA and  Surgeon  Anesthesia Plan Comments:         Anesthesia Quick Evaluation

## 2021-12-05 NOTE — Plan of Care (Signed)
  Problem: Pain Managment: Goal: General experience of comfort will improve Outcome: Progressing   Problem: Safety: Goal: Ability to remain free from injury will improve Outcome: Progressing   Problem: Skin Integrity: Goal: Risk for impaired skin integrity will decrease Outcome: Progressing   

## 2021-12-05 NOTE — H&P (Signed)
Paper H&P to be scanned into permanent record. H&P reviewed. No significant changes noted.  

## 2021-12-05 NOTE — Anesthesia Procedure Notes (Signed)
Anesthesia Regional Block: Adductor canal block   Pre-Anesthetic Checklist: , timeout performed,  Correct Patient, Correct Site, Correct Laterality,  Correct Procedure, Correct Position, site marked,  Risks and benefits discussed,  Surgical consent,  Pre-op evaluation,  At surgeon's request and post-op pain management  Laterality: Lower and Right  Prep: chloraprep       Needles:  Injection technique: Single-shot  Needle Type: Echogenic Needle     Needle Length: 9cm  Needle Gauge: 21     Additional Needles:   Procedures:,,,, ultrasound used (permanent image in chart),,    Narrative:  Start time: 12/05/2021 11:36 AM End time: 12/05/2021 11:38 AM Injection made incrementally with aspirations every 5 mL.  Performed by: Personally  Anesthesiologist: Martha Clan, MD  Additional Notes: Patient consented for risk and benefits of nerve block including but not limited to nerve damage, failed block, bleeding and infection.  Patient voiced understanding.  Functioning IV was confirmed and monitors were applied.  Timeout done prior to procedure and prior to any sedation being given to the patient.  Patient confirmed procedure site prior to any sedation given to the patient.  A 37mm 22ga Stimuplex needle was used. Sterile prep,hand hygiene and sterile gloves were used.  Minimal sedation used for procedure.  No paresthesia endorsed by patient during the procedure.  Negative aspiration and negative test dose prior to incremental administration of local anesthetic. The patient tolerated the procedure well with no immediate complications.

## 2021-12-05 NOTE — Anesthesia Postprocedure Evaluation (Signed)
Anesthesia Post Note  Patient: Jacqueline Burch  Procedure(s) Performed: Right knee arthroscopy with lateral release, MPFL reconstructionusing semitendinosus allograft, and tibial tubercle osteotomy with distalization and anteromedialization (Right: Knee)  Patient location during evaluation: PACU Anesthesia Type: General Level of consciousness: awake and alert Pain management: pain level controlled Vital Signs Assessment: post-procedure vital signs reviewed and stable Respiratory status: spontaneous breathing, nonlabored ventilation, respiratory function stable and patient connected to nasal cannula oxygen Cardiovascular status: blood pressure returned to baseline and stable Postop Assessment: no apparent nausea or vomiting Anesthetic complications: no   No notable events documented.   Last Vitals:  Vitals:   12/05/21 1600 12/05/21 1610  BP: (!) 98/59   Pulse: 85 77  Resp: 18 18  Temp:    SpO2: 95% 94%    Last Pain:  Vitals:   12/05/21 1610  TempSrc:   PainSc: Hodgkins Dalal Livengood

## 2021-12-05 NOTE — Anesthesia Procedure Notes (Signed)
Procedure Name: Intubation Date/Time: 12/05/2021 12:32 PM  Performed by: Beverely Low, CRNAPre-anesthesia Checklist: Patient identified, Patient being monitored, Timeout performed, Emergency Drugs available and Suction available Patient Re-evaluated:Patient Re-evaluated prior to induction Oxygen Delivery Method: Circle system utilized Preoxygenation: Pre-oxygenation with 100% oxygen Induction Type: IV induction Ventilation: Mask ventilation without difficulty Laryngoscope Size: 3 and McGraph Grade View: Grade I Tube type: Oral Tube size: 7.0 mm Number of attempts: 1 Airway Equipment and Method: Stylet Placement Confirmation: ETT inserted through vocal cords under direct vision, positive ETCO2 and breath sounds checked- equal and bilateral Secured at: 21 cm Tube secured with: Tape Dental Injury: Teeth and Oropharynx as per pre-operative assessment

## 2021-12-05 NOTE — Discharge Instructions (Signed)
Arthroscopic Knee Surgery & MPFL reconstruction/Tibial Tubercle Osteotomy   Post-Op Instructions   1. Bracing or crutches: Crutches will be provided at the time of discharge from the surgery center. Keep brace locked in extension at all times except as directed by physical therapy.    2. Ice: You may be provided with a device [facility]) that allows you to ice the affected area effectively. Otherwise you can ice manually.    3. Driving:  Plan on not driving for at least four weeks. Please note that you are advised NOT to drive while taking narcotic pain medications as you may be impaired and unsafe to drive.   4. Activity: Ankle pumps several times an hour while awake to prevent blood clots. Weight bearing: NO WEIGHT BEARING FOR 4 WEEKS. Use crutches for at least 4 weeks, if not 6 based on your surgery. Bending and straightening the knee is limited. Keep leg locked in extension until your physical therapist instructs you on appropriate knee bending. Elevate knee above heart level as much as possible for one week. Avoid standing more than 5 minutes (consecutively) for the first week. No exercise involving the knee until cleared by the surgeon or physical therapist.  Avoid long distance travel for 4 weeks. Use continuous passive motion (CPM) machine 6-8 hours daily as instructed starting the day after surgery (if approved by insurance).  5. Medications:  - You have been provided a prescription for narcotic pain medicine. After surgery, take 1-2 narcotic tablets every 4 hours if needed for severe pain. If it has tylenol (acetaminophen), please do not take a total of more than 3000mg /day of tylenol.  - A prescription for anti-nausea medication will be provided in case the narcotic medicine causes nausea - take 1 tablet every 6 hours only if nauseated.  - Take enteric coated aspirin 325 mg once daily for 4 weeks to prevent blood clots.  -Take tylenol 1000 every 8 hours for pain.  May stop tylenol 3 days  after surgery or when you are having minimal pain. If your narcotic has tylenol (acetaminophen), please do not take a total of more than 3000mg /day of tylenol.    If you are taking prescription medication for anxiety, depression, insomnia, muscle spasm, chronic pain, or for attention deficit disorder you are advised that you are at a higher risk of adverse effects with use of narcotics post-op, including narcotic addiction/dependence, depressed breathing, death. If you use non-prescribed substances: alcohol, marijuana, cocaine, heroin, methamphetamines, etc., you are at a higher risk of adverse effects with use of narcotics post-op, including narcotic addiction/dependence, depressed breathing, death. You are advised that taking > 50 morphine milligram equivalents (MME) of narcotic pain medication per day results in twice the risk of overdose or death. For your prescription provided: oxycodone 5 mg - taking more than 6 tablets per day. Be advised that we will prescribe narcotics short-term, for acute post-operative pain only - 1 week for minor operations such as knee arthroscopy for meniscus tear resection, and 3 weeks for major operations such as knee repair/reconstruction surgeries.   6. Bandages: The physical therapist should change the bandages at the first post-op appointment. If needed, the dressing supplies have been provided to you. You may shower after this with waterproof bandaids covering the incisions.    7. Physical Therapy: 2 times per week for the first 4 weeks, then 1-2 times per week from weeks 4-8 post-op. Therapy typically starts on post operative Day 3 or 4. You have been provided an  order for physical therapy. The therapist will provide home exercises.   8. Work: May do light duty/desk job in approximately 2 weeks when off of narcotics, pain is well-controlled, and swelling has decreased. Return to full work, especially if more labor intensive, may take approximately 3 months.    9.  Post-Op Appointments: Your first post-op appointment will be with Dr. Allena Katz in approximately 2 weeks time.    If you find that they have not been scheduled or if you have any questions about the above instructions please call the Orthopaedic Appointment front desk at [phone].

## 2021-12-05 NOTE — Transfer of Care (Signed)
Immediate Anesthesia Transfer of Care Note  Patient: Jacqueline Burch  Procedure(s) Performed: Right knee arthroscopy with lateral release, MPFL reconstructionusing semitendinosus allograft, and tibial tubercle osteotomy with distalization and anteromedialization (Right: Knee)  Patient Location: PACU  Anesthesia Type:General  Level of Consciousness: drowsy  Airway & Oxygen Therapy: Patient Spontanous Breathing  Post-op Assessment: Report given to RN and Post -op Vital signs reviewed and stable  Post vital signs: Reviewed and stable  Last Vitals:  Vitals Value Taken Time  BP 106/70 12/05/21 1545  Temp    Pulse 88 12/05/21 1546  Resp 22 12/05/21 1546  SpO2 95 % 12/05/21 1546  Vitals shown include unvalidated device data.  Last Pain:  Vitals:   12/05/21 1029  TempSrc: Temporal  PainSc: 0-No pain         Complications: No notable events documented.

## 2021-12-06 ENCOUNTER — Encounter: Payer: Self-pay | Admitting: Orthopedic Surgery

## 2021-12-06 DIAGNOSIS — S83014A Lateral dislocation of right patella, initial encounter: Secondary | ICD-10-CM | POA: Diagnosis not present

## 2021-12-06 MED ORDER — OXYCODONE HCL 5 MG PO TABS: 5.0000 mg | ORAL_TABLET | ORAL | 0 refills | Status: AC | PRN

## 2021-12-06 MED ORDER — METHOCARBAMOL 500 MG PO TABS: 500.0000 mg | ORAL_TABLET | Freq: Four times a day (QID) | ORAL | 0 refills | Status: AC | PRN

## 2021-12-06 MED ORDER — ASPIRIN 325 MG PO TBEC: 325.0000 mg | DELAYED_RELEASE_TABLET | Freq: Every day | ORAL | Status: AC

## 2021-12-06 MED ORDER — ONDANSETRON HCL 4 MG PO TABS: 4.0000 mg | ORAL_TABLET | Freq: Four times a day (QID) | ORAL | 0 refills | Status: AC | PRN

## 2021-12-06 NOTE — Discharge Summary (Signed)
Physician Discharge Summary  Subjective: 1 Day Post-Op Procedure(s) (LRB): Right knee arthroscopy with lateral release, MPFL reconstructionusing semitendinosus allograft, and tibial tubercle osteotomy with distalization and anteromedialization (Right) Patient reports pain as mild.   Patient seen in rounds with Dr. Posey Pronto. Patient is well, and has had no acute complaints or problems Patient is ready to go home after physical therapy.  Physician Discharge Summary  Patient ID: Jacqueline Burch MRN: [id number] DOB/AGE: 11/07/2000 21 y.o.  Admit date: 12/05/2021 Discharge date: 12/06/2021  Admission Diagnoses:  Discharge Diagnoses:  Principal Problem:   Patellar dislocation   Discharged Condition: fair  Hospital Course: The patient is postop day 1 from a right knee arthroscopy with MPFL reconstruction and tibial tubercle osteotomy.  She is doing well since surgery.  Her vitals have remained stable.  The patient will be doing physical therapy this morning before going home.  Treatments: surgery:  1. Right knee tibial tubercle osteotomy (distalization and anteromedialization) 2. Right knee medial patellofemoral ligament reconstruction using allograft 3. Right knee arthroscopic lateral retinacular release 4. Right knee patella chondroplasty   SURGEON:Sunny Jearld Lesch, MD   ASSISTANT(S): Reche Dixon, [place]; Evette Georges, PA-S    ANESTHESIA: Gen + regional anesthesia   TOTAL IV FLUIDS: see anesthesia record   ESTIMATED BLOOD LOSS: 50cc   TOURNIQUET TIME: 130 min   DRAINS: medium 10 Fr Hemovac   SPECIMENS: None.   IMPLANTS:  - 4.67mm cortical screws x 3 - Qfix double loaded suture anchor x 1 - Qfix Mini suture anchors x 2 - Semitendinosus allograft   COMPLICATIONS: None apparent.  Discharge Exam: Blood pressure 130/81, pulse 80, temperature 98.1 F (36.7 C), temperature source Oral, resp. rate 16, height 5' (1.524 m), weight 93 kg, last menstrual period 10/30/2021, SpO2 100  %.   Disposition: Discharge disposition: 01-Home or Self Care        Allergies as of 12/06/2021   No Known Allergies      Medication List     TAKE these medications    aspirin EC 325 MG tablet Take 1 tablet (325 mg total) by mouth daily.   cholecalciferol 25 MCG (1000 UNIT) tablet Commonly known as: VITAMIN D3 Take 1,000 Units by mouth daily. Not sure of actual dosage   methocarbamol 500 MG tablet Commonly known as: ROBAXIN Take 1 tablet (500 mg total) by mouth every 6 (six) hours as needed for muscle spasms.   ondansetron 4 MG tablet Commonly known as: ZOFRAN Take 1 tablet (4 mg total) by mouth every 6 (six) hours as needed for nausea.   oxyCODONE 5 MG immediate release tablet Commonly known as: Oxy IR/ROXICODONE Take 1 tablet (5 mg total) by mouth every 4 (four) hours as needed for moderate pain (pain score 4-6). What changed: how much to take        Follow-up Information     Leim Fabry, MD. Go in 2 week(s).   Specialty: Orthopedic Surgery Contact information: [place] [phone]                 Signed: Prescott Parma, Areebah Meinders 12/06/2021, 7:18 AM   Objective: Vital signs in last 24 hours: Temp:  [97.4 F (36.3 C)-98.9 F (37.2 C)] 98.1 F (36.7 C) (10/04 0401) Pulse Rate:  [76-109] 80 (10/04 0401) Resp:  [13-25] 16 (10/04 0401) BP: (98-151)/(59-106) 130/81 (10/04 0401) SpO2:  [93 %-100 %] 100 % (10/04 0401) Weight:  [93 kg] 93 kg (10/03 1029)  Intake/Output from previous day:  Intake/Output  Summary (Last 24 hours) at 12/06/2021 0718 Last data filed at 12/06/2021 0622 Gross per 24 hour  Intake 1309.76 ml  Output 1051 ml  Net 258.76 ml    Intake/Output this shift: No intake/output data recorded.  Labs: No results for input(s): "HGB" in the last 72 hours. No results for input(s): "WBC", "RBC", "HCT", "PLT" in the last 72 hours. No results for input(s): "NA", "K", "CL", "CO2", "BUN", "CREATININE",  "GLUCOSE", "CALCIUM" in the last 72 hours. No results for input(s): "LABPT", "INR" in the last 72 hours.  EXAM: General - Patient is Alert and Oriented Extremity - Neurovascular intact Dorsiflexion/Plantar flexion intact Compartment soft Incision - clean, dry, with a Hemovac intact.  This was removed without complication. Motor Function -plantarflexion and dorsiflexion intact.  Assessment/Plan: 1 Day Post-Op Procedure(s) (LRB): Right knee arthroscopy with lateral release, MPFL reconstructionusing semitendinosus allograft, and tibial tubercle osteotomy with distalization and anteromedialization (Right) Procedure(s) (LRB): Right knee arthroscopy with lateral release, MPFL reconstructionusing semitendinosus allograft, and tibial tubercle osteotomy with distalization and anteromedialization (Right) Past Medical History:  Diagnosis Date   Anxiety    Insomnia    Insomnia    Principal Problem:   Patellar dislocation  Estimated body mass index is 40.04 kg/m as calculated from the following:   Height as of this encounter: 5' (1.524 m).   Weight as of this encounter: 93 kg. Advance diet Up with therapy D/C IV fluids Diet - Regular diet Follow up - in 2 weeks Activity - NWB Disposition - Home Condition Upon Discharge - Stable DVT Prophylaxis - Aspirin  Reche Dixon, PA-C Orthopaedic Surgery 12/06/2021, 7:18 AM

## 2021-12-06 NOTE — Progress Notes (Signed)
DISCHARGE NOTE:  Pt and mother given discharge instructions and scripts. Pt verbalized understanding. Pt wheeled out to car by staff. Mother providing transportation.

## 2021-12-06 NOTE — Evaluation (Signed)
Occupational Therapy Evaluation Patient Details Name: Jacqueline Burch MRN: [id number] DOB: 2000-06-24 Today's Date: 12/06/2021   History of Present Illness Jacqueline Burch is a 28yoF who comes to [facility] on 12/05/21 for scheduled Rt knee surgery: Right knee tibial tubercle osteotomy (distalization and anteromedialization), Right knee medial patellofemoral ligament reconstruction using allograft, Right knee arthroscopic lateral retinacular release, and Right knee patella chondroplasty. Pt has been experiencing patella instability and knee pain. PMH: similar procedure on CL knee 08/14/21. Pt has orders for NWB x 4 weeks on AC.   Clinical Impression   Chart reviewed, pt greeted in room, educated on role of OT and agreeable to OT evaluation. PTA pt was MOD I in ADL/IADL after rehabbing from previous surgery. Pt presents with deficits in strength, activity tolerance however is is able perform ADL mobility with supervision with RW, good adherence to [place] with brace donned throughout. MIN A required for LB dressing. Pt has all recommended equipment at home, will be starting outpatient PT per MD protocol. No OT follow up recommended. OT will sign off at this time.      Recommendations for follow up therapy are one component of a multi-disciplinary discharge planning process, led by the attending physician.  Recommendations may be updated based on patient status, additional functional criteria and insurance authorization.   Follow Up Recommendations  No OT follow up    Assistance Recommended at Discharge Intermittent Supervision/Assistance  Patient can return home with the following A little help with walking and/or transfers;A little help with bathing/dressing/bathroom    Functional Status Assessment  Patient has had a recent decline in their functional status and demonstrates the ability to make significant improvements in function in a reasonable and predictable amount of time.  Equipment  Recommendations  Other (comment);None recommended by OT (pt has required equipment at home)    Recommendations for Other Services       Precautions / Restrictions Precautions Required Braces or Orthoses: Knee Immobilizer - Right Knee Immobilizer - Right: On at all times Restrictions Weight Bearing Restrictions: Yes RLE Weight Bearing: Non weight bearing      Mobility Bed Mobility Overal bed mobility: Needs Assistance Bed Mobility: Supine to Sit, Sit to Supine     Supine to sit: Modified independent (Device/Increase time) Sit to supine: Modified independent (Device/Increase time)        Transfers Overall transfer level: Needs assistance Equipment used: Rolling walker (2 wheels) Transfers: Sit to/from Stand Sit to Stand: Supervision                  Balance Overall balance assessment: Needs assistance Sitting-balance support: Feet supported Sitting balance-Leahy Scale: Normal     Standing balance support: Bilateral upper extremity supported, Reliant on assistive device for balance, During functional activity Standing balance-Leahy Scale: Good                             ADL either performed or assessed with clinical judgement   ADL Overall ADL's : Needs assistance/impaired     Grooming: Wash/dry hands;Standing           Upper Body Dressing : Set up   Lower Body Dressing: Minimal assistance Lower Body Dressing Details (indicate cue type and reason): for R sock, MOD I for left sock Toilet Transfer: Supervision/safety;Ambulation;Rolling walker (2 wheels) Toilet Transfer Details (indicate cue type and reason): good adherence to [place] Toileting- Clothing Manipulation and Hygiene: Supervision/safety       Functional mobility  during ADLs: Supervision/safety;Rolling walker (2 wheels) (approx 20' in room)       Vision Baseline Vision/History: 1 Wears glasses Patient Visual Report: No change from baseline       Perception     Praxis       Pertinent Vitals/Pain Pain Assessment Pain Assessment: 0-10 Pain Score: 2  Pain Location: R knee Pain Descriptors / Indicators: Discomfort Pain Intervention(s): Limited activity within patient's tolerance, Monitored during session, Repositioned     Hand Dominance     Extremity/Trunk Assessment Upper Extremity Assessment Upper Extremity Assessment: Overall WFL for tasks assessed   Lower Extremity Assessment Lower Extremity Assessment: Generalized weakness       Communication Communication Communication: No difficulties   Cognition Arousal/Alertness: Awake/alert Behavior During Therapy: WFL for tasks assessed/performed Overall Cognitive Status: Within Functional Limits for tasks assessed                                       General Comments       Exercises     Shoulder Instructions      Home Living Family/patient expects to be discharged to:: Private residence Living Arrangements: Parent Available Help at Discharge: Family;Available 24 hours/day Type of Home: House Home Access: Stairs to enter;Ramped entrance Entrance Stairs-Number of Steps: 3 Entrance Stairs-Rails: None Home Layout: One level     Bathroom Shower/Tub: Chief Strategy Officer: Standard     Home Equipment: Agricultural consultant (2 wheels);Shower seat          Prior Functioning/Environment Prior Level of Function : Independent/Modified Independent                        OT Problem List:        OT Treatment/Interventions:      OT Goals(Current goals can be found in the care plan section) Acute Rehab OT Goals Patient Stated Goal: go home OT Goal Formulation: With patient/family Time For Goal Achievement: 12/20/21 Potential to Achieve Goals: Good  OT Frequency:      Co-evaluation              AM-PAC OT "6 Clicks" Daily Activity     Outcome Measure Help from another person eating meals?: None Help from another person taking care of personal  grooming?: None Help from another person toileting, which includes using toliet, bedpan, or urinal?: None Help from another person bathing (including washing, rinsing, drying)?: A Little Help from another person to put on and taking off regular upper body clothing?: None Help from another person to put on and taking off regular lower body clothing?: A Little 6 Click Score: 22   End of Session Equipment Utilized During Treatment: Rolling walker (2 wheels);Right knee immobilizer  Activity Tolerance: Patient tolerated treatment well Patient left: in bed;with call bell/phone within reach  OT Visit Diagnosis: Unsteadiness on feet (R26.81)                Time: 1005-1017 OT Time Calculation (min): 12 min Charges:  OT General Charges $OT Visit: 1 Visit OT Evaluation $OT Eval Low Complexity: 1 Low Oleta Mouse, OTD OTR/L  12/06/21, 11:43 AM

## 2021-12-06 NOTE — Progress Notes (Signed)
Met with the patient and her mother in the room She has a rolling walker and 3 in 1 at home Her mother will provide transportation She has an outpatient PT appointment

## 2021-12-06 NOTE — Progress Notes (Signed)
  Subjective: 1 Day Post-Op Procedure(s) (LRB): Right knee arthroscopy with lateral release, MPFL reconstructionusing semitendinosus allograft, and tibial tubercle osteotomy with distalization and anteromedialization (Right) Patient reports pain as mild.   Patient is well, and has had no acute complaints or problems Plan is to go Home after hospital stay. Negative for chest pain and shortness of breath Fever: no Gastrointestinal: Negative for nausea and vomiting  Objective: Vital signs in last 24 hours: Temp:  [97.4 F (36.3 C)-98.9 F (37.2 C)] 98.1 F (36.7 C) (10/04 0401) Pulse Rate:  [76-109] 80 (10/04 0401) Resp:  [13-25] 16 (10/04 0401) BP: (98-151)/(59-106) 130/81 (10/04 0401) SpO2:  [93 %-100 %] 100 % (10/04 0401) Weight:  [93 kg] 93 kg (10/03 1029)  Intake/Output from previous day:  Intake/Output Summary (Last 24 hours) at 12/06/2021 0713 Last data filed at 12/06/2021 0622 Gross per 24 hour  Intake 1309.76 ml  Output 1051 ml  Net 258.76 ml    Intake/Output this shift: No intake/output data recorded.  Labs: No results for input(s): "HGB" in the last 72 hours. No results for input(s): "WBC", "RBC", "HCT", "PLT" in the last 72 hours. No results for input(s): "NA", "K", "CL", "CO2", "BUN", "CREATININE", "GLUCOSE", "CALCIUM" in the last 72 hours. No results for input(s): "LABPT", "INR" in the last 72 hours.   EXAM General - Patient is Alert and Oriented Extremity - Neurovascular intact Sensation intact distally Dorsiflexion/Plantar flexion intact Compartment soft Dressing/Incision - clean, dry, with a Hemovac intact.  It was removed with no complication. Motor Function - intact, moving foot and toes well on exam.   Past Medical History:  Diagnosis Date   Anxiety    Insomnia    Insomnia     Assessment/Plan: 1 Day Post-Op Procedure(s) (LRB): Right knee arthroscopy with lateral release, MPFL reconstructionusing semitendinosus allograft, and tibial tubercle  osteotomy with distalization and anteromedialization (Right) Principal Problem:   Patellar dislocation  Estimated body mass index is 40.04 kg/m as calculated from the following:   Height as of this encounter: 5' (1.524 m).   Weight as of this encounter: 93 kg. Advance diet Up with therapy D/C IV fluids  DVT Prophylaxis - Aspirin Nonweightbearing to right leg  Reche Dixon, PA-C Orthopaedic Surgery 12/06/2021, 7:13 AM

## 2021-12-06 NOTE — Evaluation (Signed)
Physical Therapy Evaluation Patient Details Name: Jacqueline Burch MRN: [id number] DOB: 2000-09-13 Today's Date: 12/06/2021  History of Present Illness  Jacqueline Burch is a 21yoF who comes to [facility] on 12/05/21 for scheduled Rt knee surgery: Right knee tibial tubercle osteotomy (distalization and anteromedialization), Right knee medial patellofemoral ligament reconstruction using allograft, Right knee arthroscopic lateral retinacular release, and Right knee patella chondroplasty. Pt has been experiencing patella instability and knee pain. PMH: similar procedure on CL knee 08/14/21. Pt has orders for NWB x 4 weeks on AC.  Clinical Impression  Pt admitted with above Dx. Pt has functional limitations due to deficits below (see "PT Problem List"). Pt able to provide details on baseline functional status. Pt performed all mobility with modified independence, already has good understanding of weightbearing status, educated on brace fitting at it had slid down and was a bit too lose to perform adequate support. Pt AMB 176ft with great effort, but appears safe and is able to maintain NWB. Pt ready for DC to home from PT standpoint.   No data found.       Recommendations for follow up therapy are one component of a multi-disciplinary discharge planning process, led by the attending physician.  Recommendations may be updated based on patient status, additional functional criteria and insurance authorization.  Follow Up Recommendations Follow physician's recommendations for discharge plan and follow up therapies      Assistance Recommended at Discharge Set up Supervision/Assistance  Patient can return home with the following  A little help with bathing/dressing/bathroom;A little help with walking and/or transfers    Equipment Recommendations None recommended by PT  Recommendations for Other Services       Functional Status Assessment Patient has had a recent decline in their functional status and  demonstrates the ability to make significant improvements in function in a reasonable and predictable amount of time.     Precautions / Restrictions Precautions Required Braces or Orthoses: Knee Immobilizer - Right Knee Immobilizer - Right: On at all times Restrictions Weight Bearing Restrictions: Yes RLE Weight Bearing: Non weight bearing      Mobility  Bed Mobility Overal bed mobility: Modified Independent                  Transfers Overall transfer level: Modified independent Equipment used: Rolling walker (2 wheels)                    Ambulation/Gait Ambulation/Gait assistance: Supervision Gait Distance (Feet): 100 Feet Assistive device: Rolling walker (2 wheels) Gait Pattern/deviations: Step-to pattern       General Gait Details: maintains NWB  Stairs            Wheelchair Mobility    Modified Rankin (Stroke Patients Only)       Balance                                             Pertinent Vitals/Pain Pain Assessment Pain Assessment: 0-10 Pain Score: 1  Pain Location: R knee Pain Descriptors / Indicators: Discomfort Pain Intervention(s): Limited activity within patient's tolerance, Monitored during session, Premedicated before session    Home Living Family/patient expects to be discharged to:: Private residence Living Arrangements: Parent Available Help at Discharge: Family;Available 24 hours/day Type of Home: House Home Access: Stairs to enter;Ramped entrance Entrance Stairs-Rails: None Entrance Stairs-Number of Steps: 3   Home Layout: One  level Home Equipment: Conservation officer, nature (2 wheels);Shower seat      Prior Function Prior Level of Function : Independent/Modified Independent                     Hand Dominance        Extremity/Trunk Assessment   Upper Extremity Assessment Upper Extremity Assessment: Overall WFL for tasks assessed    Lower Extremity Assessment Lower Extremity  Assessment: Generalized weakness       Communication   Communication: No difficulties  Cognition Arousal/Alertness: Awake/alert Behavior During Therapy: WFL for tasks assessed/performed Overall Cognitive Status: Within Functional Limits for tasks assessed                                          General Comments      Exercises     Assessment/Plan    PT Assessment Patient needs continued PT services  PT Problem List Decreased strength;Decreased range of motion;Decreased activity tolerance;Decreased balance;Decreased mobility;Decreased coordination;Decreased cognition;Decreased knowledge of use of DME       PT Treatment Interventions DME instruction;Balance training;Gait training;Stair training;Functional mobility training;Therapeutic activities;Therapeutic exercise;Patient/family education    PT Goals (Current goals can be found in the Care Plan section)  Acute Rehab PT Goals Patient Stated Goal: be independent in household mobility PT Goal Formulation: With patient/family Time For Goal Achievement: 12/20/21 Potential to Achieve Goals: Good    Frequency 7X/week     Co-evaluation               AM-PAC PT "6 Clicks" Mobility  Outcome Measure Help needed turning from your back to your side while in a flat bed without using bedrails?: A Little Help needed moving from lying on your back to sitting on the side of a flat bed without using bedrails?: A Little Help needed moving to and from a bed to a chair (including a wheelchair)?: A Little Help needed standing up from a chair using your arms (e.g., wheelchair or bedside chair)?: A Little Help needed to walk in hospital room?: A Little Help needed climbing 3-5 steps with a railing? : A Little 6 Click Score: 18    End of Session Equipment Utilized During Treatment: Gait belt;Right knee immobilizer Activity Tolerance: Patient tolerated treatment well;Patient limited by fatigue;No increased pain Patient  left: in bed;with nursing/sitter in room;with call bell/phone within reach Nurse Communication: Mobility status PT Visit Diagnosis: Other abnormalities of gait and mobility (R26.89)    Time: 4008-6761 PT Time Calculation (min) (ACUTE ONLY): 15 min   Charges:   PT Evaluation $PT Eval Low Complexity: 1 Low        11:45 AM, 12/06/21 Etta Grandchild, PT, DPT Physical Therapist - [facility]  [phone] ([place])   Tai Skelly C 12/06/2021, 11:42 AM

## 2021-12-06 NOTE — Plan of Care (Signed)
Patient sleeping between care. Aox4. Pain medication offered but patient refused. Surgical dressing intact. Hinged brace intact. Call bell within reach. Mother at bedside.    Problem: Education: Goal: Knowledge of General Education information will improve Description: Including pain rating scale, medication(s)/side effects and non-pharmacologic comfort measures Outcome: Progressing   Problem: Health Behavior/Discharge Planning: Goal: Ability to manage health-related needs will improve Outcome: Progressing   Problem: Clinical Measurements: Goal: Ability to maintain clinical measurements within normal limits will improve Outcome: Progressing Goal: Will remain free from infection Outcome: Progressing Goal: Diagnostic test results will improve Outcome: Progressing Goal: Respiratory complications will improve Outcome: Progressing Goal: Cardiovascular complication will be avoided Outcome: Progressing   Problem: Activity: Goal: Risk for activity intolerance will decrease Outcome: Progressing   Problem: Nutrition: Goal: Adequate nutrition will be maintained Outcome: Progressing   Problem: Coping: Goal: Level of anxiety will decrease Outcome: Progressing   Problem: Elimination: Goal: Will not experience complications related to bowel motility Outcome: Progressing Goal: Will not experience complications related to urinary retention Outcome: Progressing   Problem: Pain Managment: Goal: General experience of comfort will improve Outcome: Progressing   Problem: Safety: Goal: Ability to remain free from injury will improve Outcome: Progressing   Problem: Skin Integrity: Goal: Risk for impaired skin integrity will decrease Outcome: Progressing

## 2023-07-17 IMAGING — MR MR KNEE*L* W/O CM
6 series · 40 of 40 positions shown · non-contrast
Comparison: None.

CLINICAL DATA: Multiple patellar dislocations. Undergoing physical
therapy. Evaluate for osteochondral defect.

EXAM:
MRI OF THE LEFT KNEE WITHOUT CONTRAST
TECHNIQUE: Multiplanar, multisequence MR imaging of the knee was performed. No
intravenous contrast was administered.

[Series 4: T2 fat-sat · axial · left · 4.0mm · 0.47mm/px · z∈[-62,+88]mm · 7 of 31 slices shown (1 of 3)]
[im 1/31]
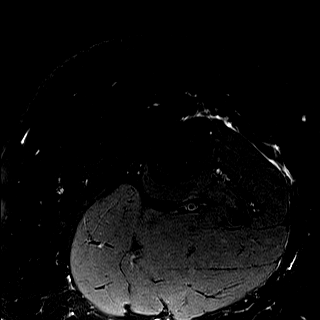
[im 6/31]
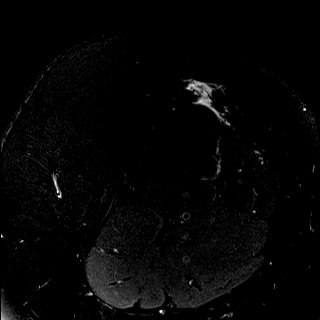
[im 11/31]
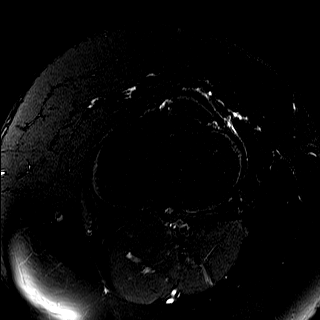
[im 16/31]
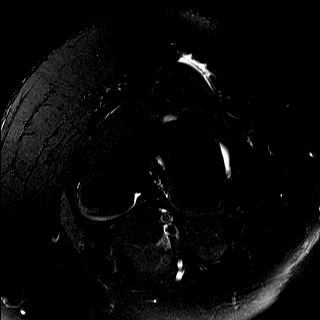
[im 21/31]
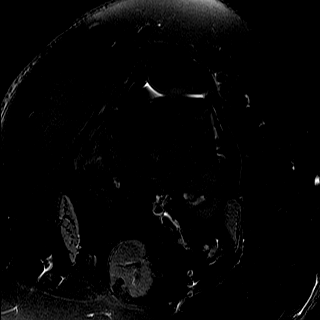
[im 26/31]
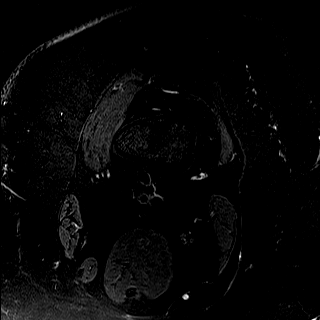
[im 31/31]
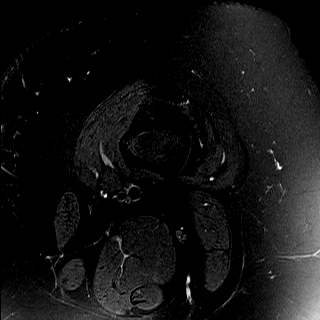

[Series 5: T1 · coronal · left · 4.0mm · 0.50mm/px · 6 of 24 slices shown]
[im 1/24]
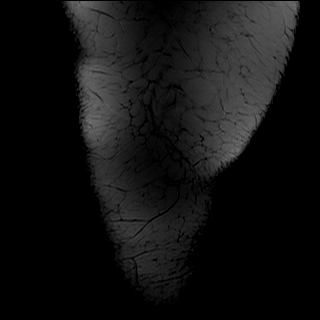
[im 5/24]
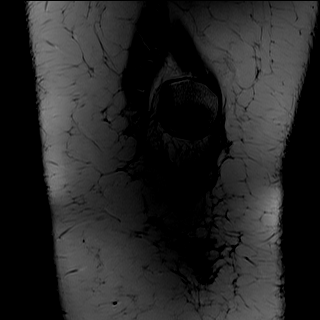
[im 10/24]
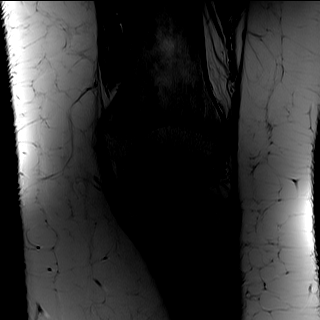
[im 14/24]
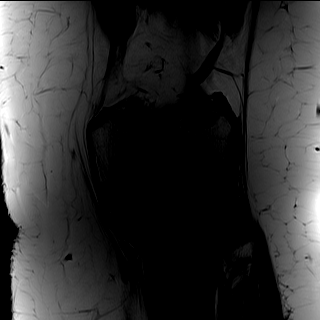
[im 19/24]
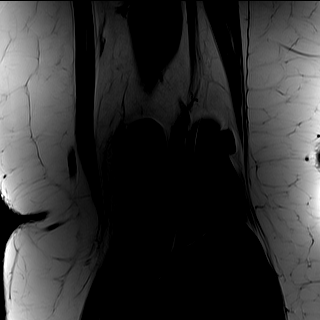
[im 24/24]
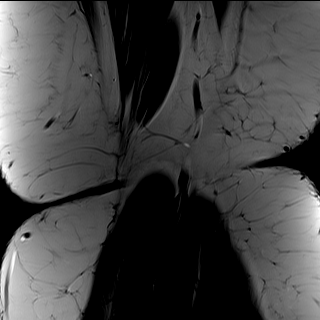

[Series 6: T2 fat-sat · coronal · left · 4.0mm · 0.50mm/px · 6 of 24 slices shown (2 of 3)]
[im 1/24]
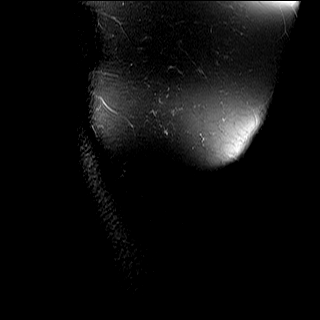
[im 5/24]
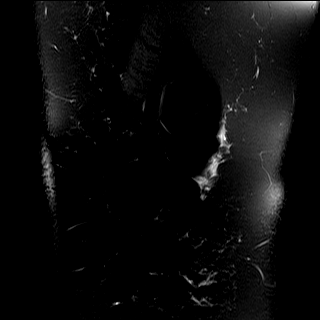
[im 10/24]
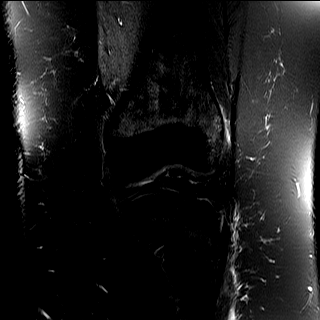
[im 14/24]
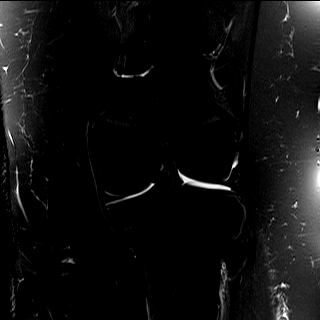
[im 19/24]
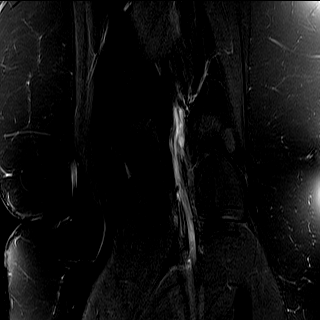
[im 24/24]
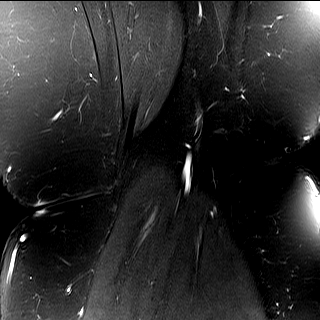

[Series 7: PD fat-sat · coronal · left · 3.0mm · 0.50mm/px · 7 of 30 slices shown (1 of 2)]
[im 1/30]
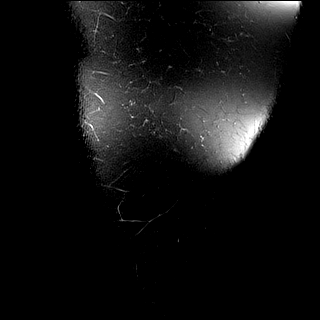
[im 5/30]
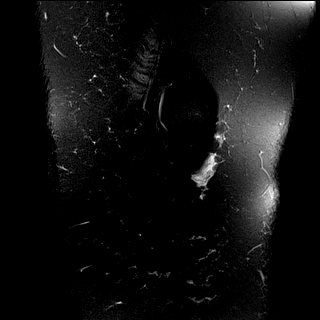
[im 10/30]
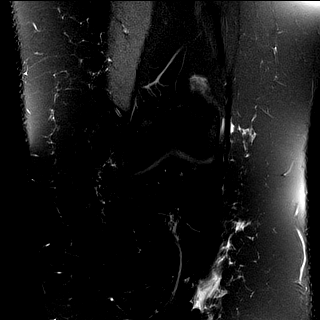
[im 15/30]
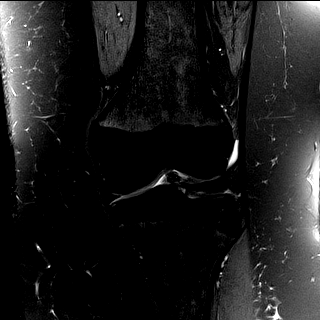
[im 20/30]
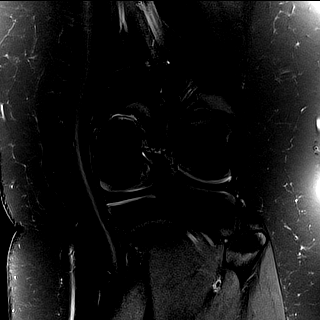
[im 25/30]
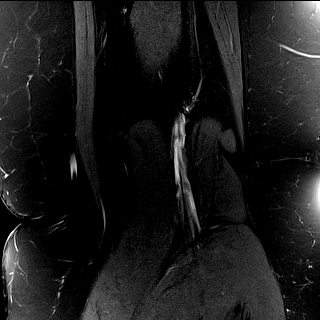
[im 30/30]
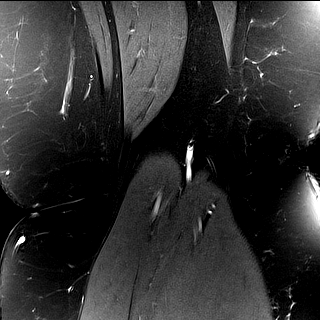

[Series 8: PD fat-sat · sagittal · left · 3.0mm · 0.47mm/px · 7 of 27 slices shown (2 of 2)]
[im 1/27]
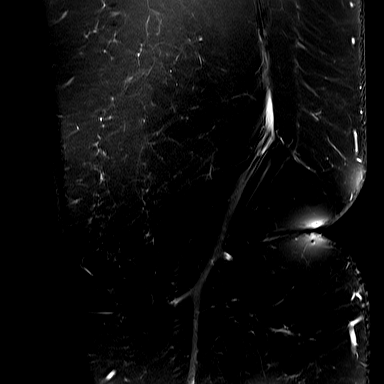
[im 5/27]
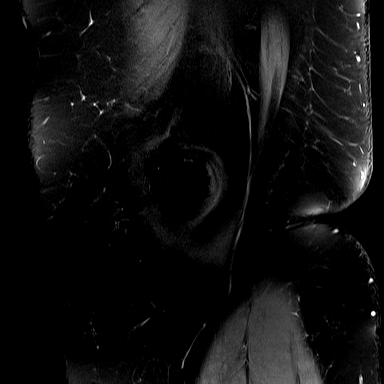
[im 9/27]
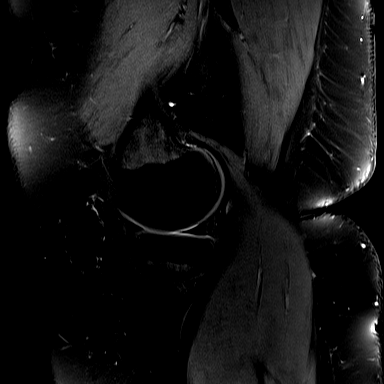
[im 14/27]
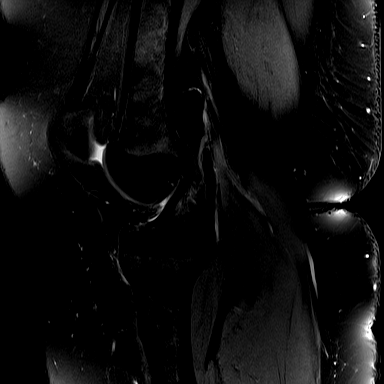
[im 18/27]
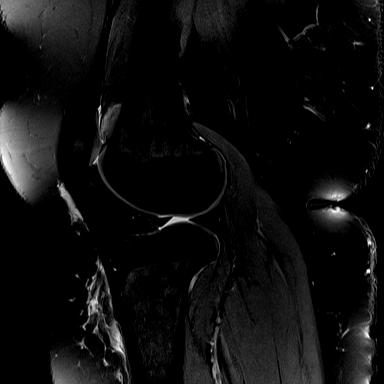
[im 22/27]
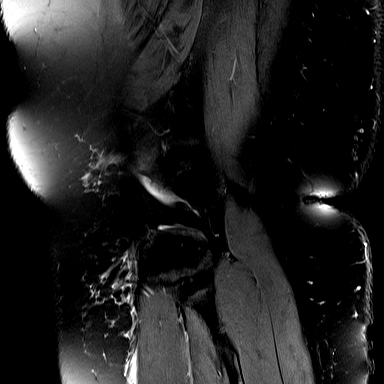
[im 27/27]
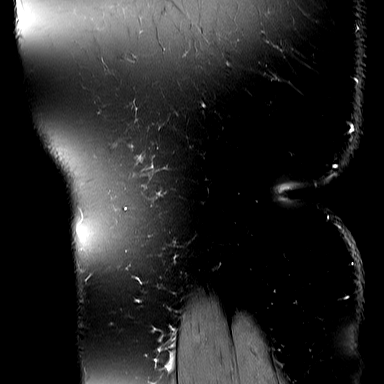

[Series 9: T2 fat-sat · sagittal · left · 3.0mm · 0.56mm/px · 7 of 27 slices shown (3 of 3)]
[im 1/27]
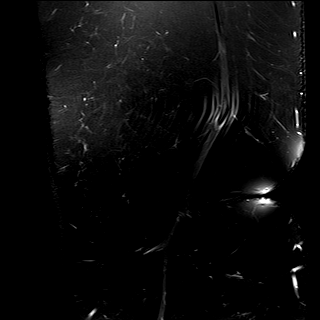
[im 5/27]
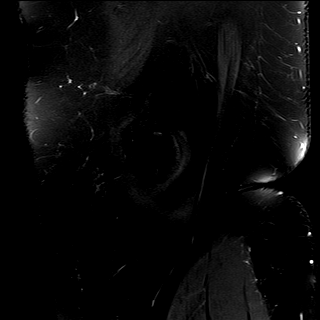
[im 9/27]
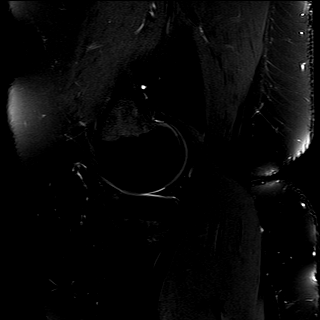
[im 14/27]
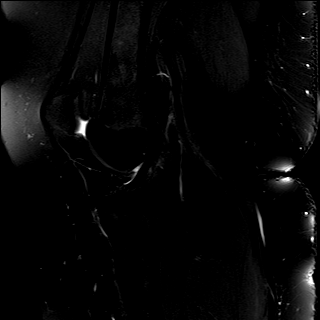
[im 18/27]
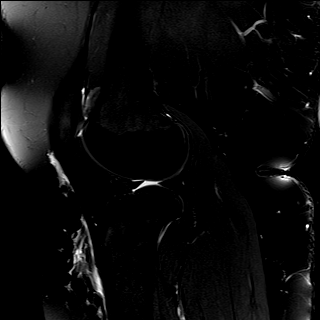
[im 22/27]
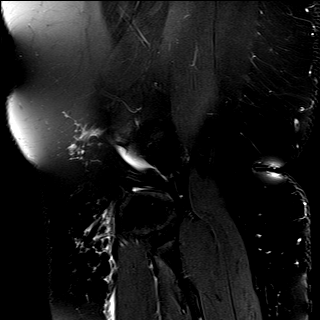
[im 27/27]
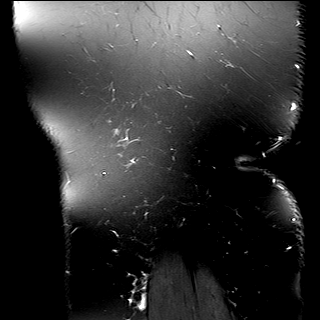

[40 of 40 positions shown; findings below may reference images not displayed]

FINDINGS: MENISCI

Medial meniscus:  Intact with normal morphology.

Lateral meniscus: Meniscal flounce deformity noted without evidence
of tear or displaced meniscal fragment.

LIGAMENTS

Cruciates:  Intact.

Collaterals:  Intact.

CARTILAGE

Patellofemoral:  Preserved.

Medial:  Preserved.

Lateral:  Preserved.

MISCELLANEOUS

Joint:  No significant joint effusion.

Popliteal Fossa: The popliteus muscle and tendon are intact. No
significant Baker's cyst.

Extensor Mechanism: Intact. There is a mild patella alta. There is
no significant edema in Hoffa's fat, although there is mild edema in
the distal pre femoral fat. The patellar retinacula appear intact.

Bones: Lateral tilting of the patella. No evidence of patellar or
femoral bone contusion. The femoral trochlea appears adequately
formed. The tibial tubercle/trochlear groove (TT-TG) distance is 19
mm (borderline abnormal).

Other: Mild prepatellar subcutaneous edema.
IMPRESSION: 1. No signs of recent transient patellar dislocation injury or focal
osteochondral defect.
2. Patella alta, lateral tilting of the patella and a borderline
increased tibial tubercle/trochlear groove distance may predispose
to transient patellar dislocation injury.
3. Intact menisci, cruciate and collateral ligaments.

## 2023-08-04 IMAGING — CR DG BONE LENGTH
1 series · 4 of 4 positions shown · non-contrast
Comparison: None Available.

CLINICAL DATA: eval mechanical alignment of knees

EXAM:
BONE LENGTH

[Series 1: long bone ap · 0.14mm/px · 4 of 4 slices shown]
[im 1/4]
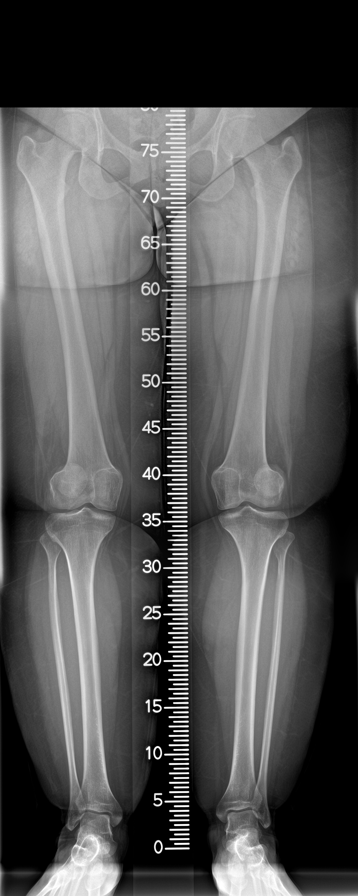
[im 2/4]
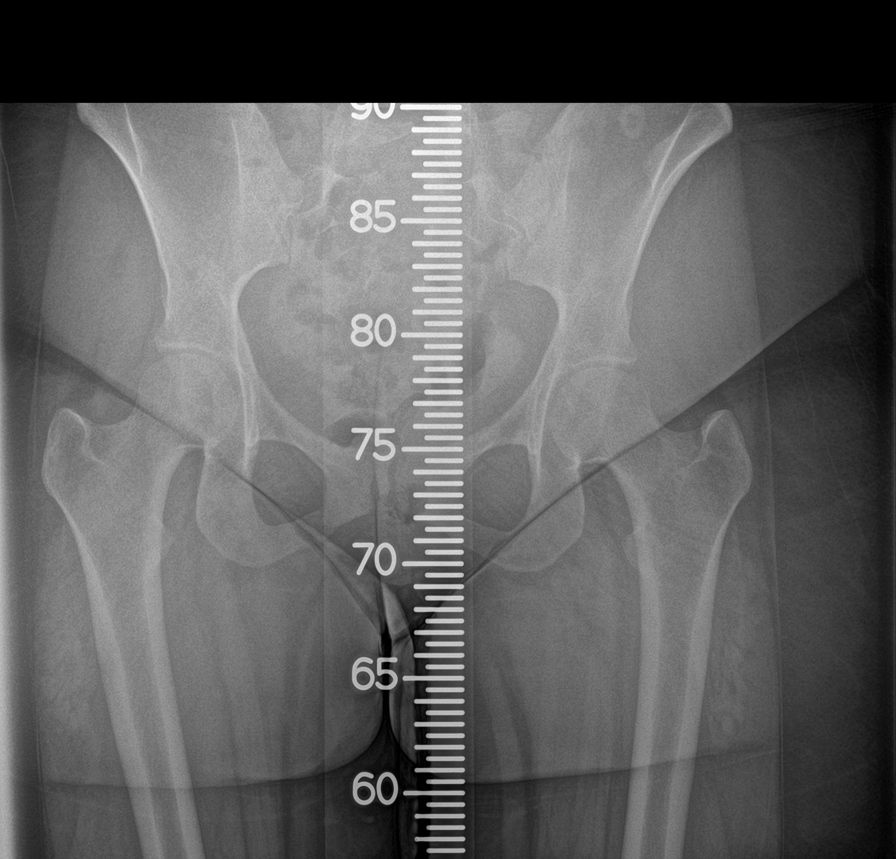
[im 3/4]
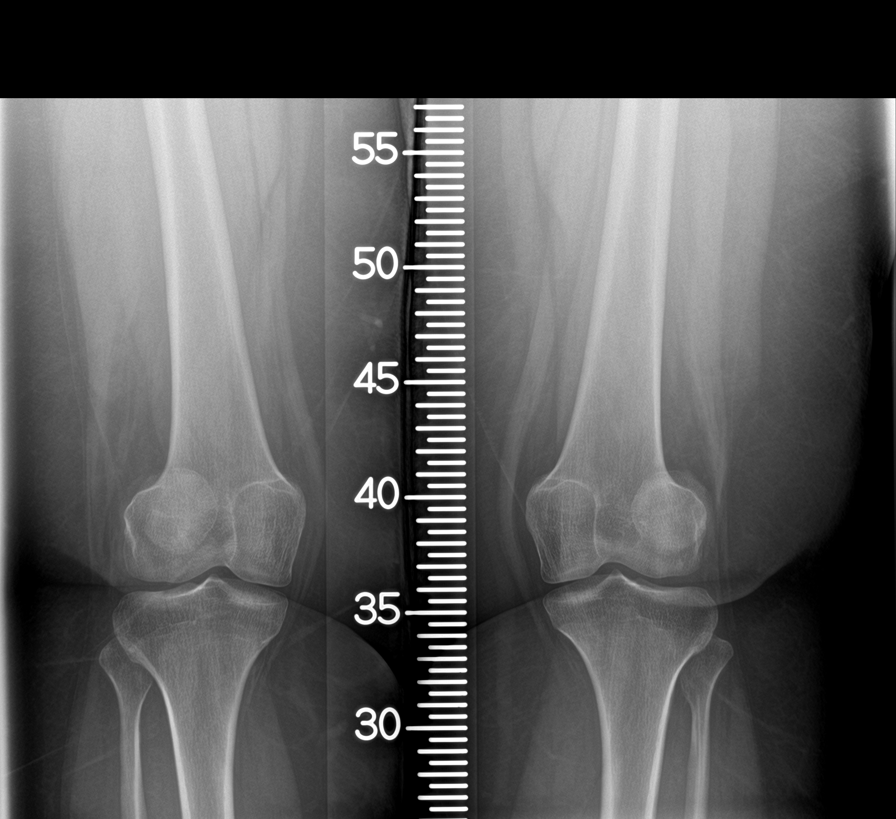
[im 4/4]
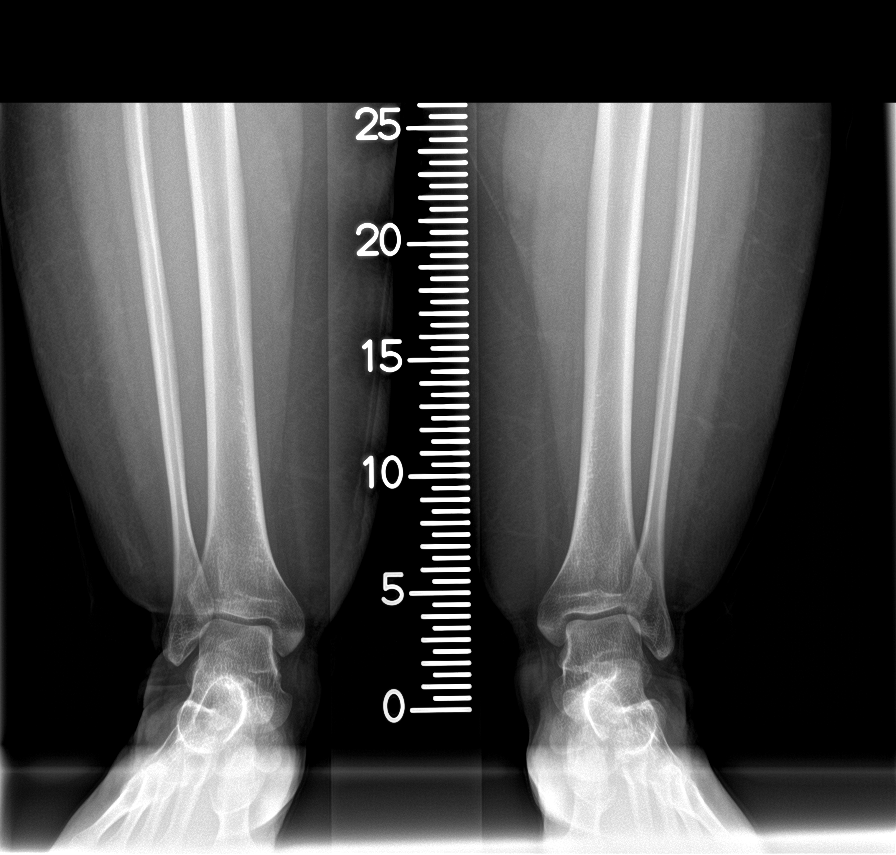

[4 of 4 positions shown; findings below may reference images not displayed]

FINDINGS: Bone length measurements as obtained from the femoral head apex to
the central tibial plafond. Right lower extremity length of 82.4 cm.
Left lower extremity length of 81.5 cm. Neutral alignment at the
knees. No acute bony findings.
IMPRESSION: Leg lengths, as above.
